# Patient Record
Sex: Female | Born: 1965 | Race: Black or African American | Hispanic: No | Marital: Single | Smoking: Never smoker
Health system: Southern US, Community
[De-identification: ages and names within clinical notes are randomized; demographics above are authoritative.]

## PROBLEM LIST (undated history)

## (undated) DIAGNOSIS — I10 Essential (primary) hypertension: Secondary | ICD-10-CM

## (undated) DIAGNOSIS — J45909 Unspecified asthma, uncomplicated: Secondary | ICD-10-CM

## (undated) DIAGNOSIS — I776 Arteritis, unspecified: Secondary | ICD-10-CM

## (undated) DIAGNOSIS — M069 Rheumatoid arthritis, unspecified: Secondary | ICD-10-CM

## (undated) HISTORY — DX: Unspecified asthma, uncomplicated: J45.909

## (undated) HISTORY — DX: Essential (primary) hypertension: I10

## (undated) HISTORY — DX: Rheumatoid arthritis, unspecified: M06.9

## (undated) HISTORY — DX: Arteritis, unspecified: I77.6

---

## 1997-02-06 HISTORY — PX: CYST REMOVAL TRUNK: SHX6283

## 2004-02-07 HISTORY — PX: ABDOMINAL HYSTERECTOMY: SHX81

## 2009-06-03 ENCOUNTER — Ambulatory Visit: Payer: Self-pay

## 2010-06-07 ENCOUNTER — Ambulatory Visit: Payer: Self-pay | Admitting: Internal Medicine

## 2011-06-07 ENCOUNTER — Ambulatory Visit: Payer: Self-pay

## 2011-06-08 ENCOUNTER — Ambulatory Visit: Payer: Self-pay | Admitting: Internal Medicine

## 2013-11-14 DIAGNOSIS — M5442 Lumbago with sciatica, left side: Secondary | ICD-10-CM | POA: Insufficient documentation

## 2013-11-14 DIAGNOSIS — I10 Essential (primary) hypertension: Secondary | ICD-10-CM | POA: Insufficient documentation

## 2013-11-14 DIAGNOSIS — J3089 Other allergic rhinitis: Secondary | ICD-10-CM | POA: Insufficient documentation

## 2015-01-21 DIAGNOSIS — R002 Palpitations: Secondary | ICD-10-CM | POA: Insufficient documentation

## 2015-01-21 DIAGNOSIS — R9431 Abnormal electrocardiogram [ECG] [EKG]: Secondary | ICD-10-CM | POA: Insufficient documentation

## 2016-12-21 LAB — LIPID PANEL
Cholesterol: 222 — AB (ref 0–200)
Triglycerides: 67 (ref 40–160)

## 2017-05-28 DIAGNOSIS — J984 Other disorders of lung: Secondary | ICD-10-CM | POA: Insufficient documentation

## 2017-06-14 LAB — CBC AND DIFFERENTIAL
HCT: 42 (ref 36–46)
Hemoglobin: 13.2 (ref 12.0–16.0)
Platelets: 242 (ref 150–399)
WBC: 5.8

## 2017-06-14 LAB — HEPATIC FUNCTION PANEL
ALT: 12 (ref 7–35)
AST: 17 (ref 13–35)
Alkaline Phosphatase: 79 (ref 25–125)
Bilirubin, Total: 0.2

## 2017-06-14 LAB — BASIC METABOLIC PANEL
CO2: 28 — AB (ref 13–22)
Chloride: 104 (ref 99–108)
Creatinine: 0.8 (ref 0.5–1.1)
Glucose: 79
Potassium: 4 (ref 3.4–5.3)
Sodium: 145 (ref 137–147)

## 2017-06-14 LAB — COMPREHENSIVE METABOLIC PANEL
Albumin: 4.4 (ref 3.5–5.0)
Calcium: 9.5 (ref 8.7–10.7)
GFR calc Af Amer: 96
GFR calc non Af Amer: 83
Globulin: 3.1

## 2017-06-14 LAB — CBC: RBC: 4.43 (ref 3.87–5.11)

## 2017-06-14 LAB — VITAMIN D 25 HYDROXY (VIT D DEFICIENCY, FRACTURES): Vit D, 25-Hydroxy: 20

## 2017-06-14 LAB — MICROALBUMIN, URINE: Microalb, Ur: 22.8

## 2017-07-20 DIAGNOSIS — G4733 Obstructive sleep apnea (adult) (pediatric): Secondary | ICD-10-CM | POA: Insufficient documentation

## 2017-12-21 LAB — LIPID PANEL
HDL: 67 (ref 35–70)
LDL Cholesterol: 142

## 2018-01-29 LAB — HM MAMMOGRAPHY: HM Mammogram: NORMAL (ref 0–4)

## 2019-02-19 ENCOUNTER — Encounter: Payer: Self-pay | Admitting: Internal Medicine

## 2019-02-19 ENCOUNTER — Ambulatory Visit (INDEPENDENT_AMBULATORY_CARE_PROVIDER_SITE_OTHER): Payer: Medicare Other | Admitting: Internal Medicine

## 2019-02-19 ENCOUNTER — Other Ambulatory Visit: Payer: Self-pay

## 2019-02-19 VITALS — BP 134/78 | HR 93 | Temp 98.1°F | Ht 67.0 in | Wt 241.0 lb

## 2019-02-19 DIAGNOSIS — J984 Other disorders of lung: Secondary | ICD-10-CM | POA: Diagnosis not present

## 2019-02-19 DIAGNOSIS — M62838 Other muscle spasm: Secondary | ICD-10-CM

## 2019-02-19 DIAGNOSIS — I6523 Occlusion and stenosis of bilateral carotid arteries: Secondary | ICD-10-CM | POA: Diagnosis not present

## 2019-02-19 DIAGNOSIS — J0100 Acute maxillary sinusitis, unspecified: Secondary | ICD-10-CM

## 2019-02-19 MED ORDER — DOXYCYCLINE HYCLATE 100 MG PO TABS: 100.0000 mg | ORAL_TABLET | Freq: Two times a day (BID) | ORAL | 0 refills | Status: AC

## 2019-02-19 NOTE — Progress Notes (Signed)
Date:  02/19/2019   Name:  Adrienne Young   DOB:  11/02/1965   MRN:  [id number]   Chief Complaint: Establish Care (Re-establishing care with Dr Army Melia.)  Adrienne Young is a returning previous patient.  She spent the past 7 years working in [place] but is now back in [place].  She is a previous history of hypertension but states that she has not taken medication in 18 months.  While in [place] she apparently had a cardiac evaluation which involved several ECHOs and an apparent carotid ultrasound.  She says that carotid stenosis of about 40% was found but that no surgery or medication was recommended.  Her main complaint today is recurrent muscle spasms or muscle twitching that occurs in various parts of her body at different times.  Most of her symptoms appear to be in her upper extremities and the left side of her face and left upper eyelid.  She reports having had a number of tests with a neurologist in [place].  These included blood work, nerve conduction studies, and CT scans.  She states that no definitive diagnosis was ever made.  She was prescribed Flexeril which she was able to use as needed to control the spasms.  She does report that nerve conduction in her left leg was supposedly abnormal.  Otherwise she has no diagnosis to explain her symptoms.  She also states that during this time she was somehow referred for disability.  She underwent a disability evaluation and was granted Social Security disability.  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  She personally does not feel that she is disabled and that the social system in [place] is poorly managed.  She complains of some chronic shortness of breath that started in approximately 2015.  At that time she was seen for a sinus infection and given a steroid injection intramuscularly.  After that she became much more short of breath despite not having a diagnosis of asthma.  She reports seeing a pulmonologist but not being  placed on any medication or given a definitive diagnosis.  She also feels that the steroid injection incited a lot of her muscular complaints.  Sinusitis -patient states that she has current sinus pressure and congestion across her face and forehead.  She has production of green thick phlegm both from her chest and from her nose.  She is not using any current over-the-counter or prescription medications.  Review of Systems  Constitutional: Negative for chills, fatigue, fever and unexpected weight change.  HENT: Positive for congestion, sinus pressure and sinus pain. Negative for trouble swallowing.   Eyes: Negative for visual disturbance.  Respiratory: Positive for shortness of breath and wheezing. Negative for choking and chest tightness.   Cardiovascular: Negative for chest pain and palpitations.  Musculoskeletal: Positive for myalgias. Negative for arthralgias, gait problem and joint swelling.  Neurological: Positive for weakness. Negative for dizziness, tremors, light-headedness and headaches.  Psychiatric/Behavioral: Negative for dysphoric mood and sleep disturbance. The patient is not nervous/anxious.     Patient Active Problem List   Diagnosis Date Noted  . Obstructive sleep apnea 07/20/2017  . Restrictive lung disease 05/28/2017  . Palpitations 01/21/2015  . Abnormal ambulatory electrocardiogram 01/21/2015  . Other allergic rhinitis 11/14/2013  . Right-sided low back pain without sciatica 11/14/2013  . Essential hypertension 11/14/2013    Allergies  Allergen Reactions  . Dexamethasone Anaphylaxis    Difficulty breathing  . Prednisone Anaphylaxis    Diff breathing  .  Sulfur Hives    Past Surgical History:  Procedure Laterality Date  . ABDOMINAL HYSTERECTOMY  2006   ovaries still present  . CYST REMOVAL TRUNK  1999    Social History   Tobacco Use  . Smoking status: Never Smoker  . Smokeless tobacco: Never Used  Substance Use Topics  . Alcohol use: Never  . Drug  use: Never     Medication list has been reviewed and updated.  Current Meds  Medication Sig  . cyclobenzaprine (FLEXERIL) 10 MG tablet Take 10 mg by mouth 3 (three) times daily as needed for muscle spasms. All over muscle spasms.    PHQ 2/9 Scores 02/19/2019  PHQ - 2 Score 0  PHQ- 9 Score 0    BP Readings from Last 3 Encounters:  02/19/19 134/78    Physical Exam Vitals and nursing note reviewed.  Constitutional:      General: She is not in acute distress.    Appearance: She is well-developed. She is obese.  HENT:     Head: Normocephalic and atraumatic.  Eyes:     General: Lids are normal.     Extraocular Movements: Extraocular movements intact.  Neck:     Vascular: No carotid bruit.  Cardiovascular:     Rate and Rhythm: Normal rate and regular rhythm.     Pulses:          Dorsalis pedis pulses are 1+ on the right side and 1+ on the left side.       Posterior tibial pulses are 1+ on the right side and 1+ on the left side.     Heart sounds: No murmur.  Pulmonary:     Effort: Pulmonary effort is normal. No respiratory distress.     Breath sounds: Normal breath sounds. No wheezing or rhonchi.  Musculoskeletal:        General: Normal range of motion.     Cervical back: Normal range of motion.     Right lower leg: No edema.     Left lower leg: No edema.     Right foot: Deformity present.     Left foot: Deformity (second toe hammertoe deformity) present.  Feet:     Right foot:     Skin integrity: Dry skin present.     Left foot:     Skin integrity: Dry skin present.  Lymphadenopathy:     Cervical: No cervical adenopathy.  Skin:    General: Skin is warm and dry.     Findings: No rash.  Neurological:     Mental Status: She is alert and oriented to person, place, and time.     Cranial Nerves: Cranial nerves are intact.     Sensory: Sensation is intact.     Motor: Motor function is intact. No weakness or tremor.     Coordination: Coordination is intact.     Deep  Tendon Reflexes:     Reflex Scores:      Bicep reflexes are 2+ on the right side and 2+ on the left side.      Patellar reflexes are 2+ on the right side and 2+ on the left side.    Comments: No temporal artery tenderness or cord  Psychiatric:        Behavior: Behavior normal.        Thought Content: Thought content normal.     Wt Readings from Last 3 Encounters:  02/19/19 241 lb (109.3 kg)    BP 134/78   Pulse 93  Temp 98.1 F (36.7 C) (Oral)   Ht 5\' 7"  (1.702 m)   Wt 241 lb (109.3 kg)   SpO2 99%   BMI 37.75 kg/m   Assessment and Plan: 1. Restrictive lung disease She continues to have symptoms of vague shortness of breath, difficulty taking a deep breath and recurrent sinus/chest infections I believe she will need further in-depth testing so will refer to pulmonary for further evaluation - Ambulatory referral to Pulmonology  2. Bilateral carotid artery stenosis No bruits appreciated on exam today Will obtain previous records and studies and refer or treat as appropriate  3. Muscle spasm Her muscle spasms along with weakness and pain that seem to be intermittent and migratory or suggestive of a diffuse CNS process. Again will obtain previous records but proceed with neurology evaluation without delay. She may continue to use Flexeril as needed for spasms. - Ambulatory referral to Neurology  4. Acute non-recurrent maxillary sinusitis Recommend Mucinex D for decongestant activity Avoid steroids including steroid nasal spray due to previous possible adverse reaction. - doxycycline (VIBRA-TABS) 100 MG tablet; Take 1 tablet (100 mg total) by mouth 2 (two) times daily for 10 days.  Dispense: 20 tablet; Refill: 0   Partially dictated using . Any errors are unintentional.  Animal nutritionist, MD [facility]  02/19/2019

## 2019-02-25 ENCOUNTER — Telehealth: Payer: Self-pay

## 2019-02-25 NOTE — Telephone Encounter (Signed)
Pt called to follow up on Neurology referral. Informed her the referral was faxed 02/19/19 and to give [facility] a little more time to contact her. Told her if they don't contact her by next week to call me back and let me know.   Lovett Calender, CMA

## 2019-03-04 DIAGNOSIS — E559 Vitamin D deficiency, unspecified: Secondary | ICD-10-CM | POA: Insufficient documentation

## 2019-03-07 ENCOUNTER — Telehealth: Payer: Self-pay

## 2019-03-07 NOTE — Telephone Encounter (Signed)
Patient called just to let our office know she was scheduled with Dr Malvin Johns for Neurology appt 03/12/2019.  FYI.

## 2019-03-12 LAB — TSH: TSH: 3.04 (ref 0.41–5.90)

## 2019-03-12 LAB — CBC AND DIFFERENTIAL
Hemoglobin: 13.2 (ref 12.0–16.0)
Platelets: 317 (ref 150–399)
WBC: 7

## 2019-03-12 LAB — HEPATIC FUNCTION PANEL
ALT: 12 (ref 7–35)
AST: 15 (ref 13–35)
Alkaline Phosphatase: 86 (ref 25–125)

## 2019-03-12 LAB — BASIC METABOLIC PANEL
BUN: 14 (ref 4–21)
Creatinine: 0.9 (ref 0.5–1.1)
Glucose: 103
Potassium: 4.5 (ref 3.4–5.3)
Sodium: 140 (ref 137–147)

## 2019-03-12 LAB — VITAMIN B12: Vitamin B-12: 265

## 2019-03-12 LAB — HEMOGLOBIN A1C: Hemoglobin A1C: 6.1

## 2019-03-14 DIAGNOSIS — R52 Pain, unspecified: Secondary | ICD-10-CM | POA: Insufficient documentation

## 2019-03-14 DIAGNOSIS — M62838 Other muscle spasm: Secondary | ICD-10-CM | POA: Insufficient documentation

## 2019-03-14 DIAGNOSIS — R200 Anesthesia of skin: Secondary | ICD-10-CM | POA: Insufficient documentation

## 2019-03-14 DIAGNOSIS — E538 Deficiency of other specified B group vitamins: Secondary | ICD-10-CM | POA: Insufficient documentation

## 2019-03-14 DIAGNOSIS — H02402 Unspecified ptosis of left eyelid: Secondary | ICD-10-CM | POA: Insufficient documentation

## 2019-03-19 ENCOUNTER — Ambulatory Visit: Payer: Medicare Other | Admitting: Pulmonary Disease

## 2019-03-19 ENCOUNTER — Encounter: Payer: Self-pay | Admitting: Pulmonary Disease

## 2019-03-19 ENCOUNTER — Other Ambulatory Visit: Payer: Self-pay

## 2019-03-19 VITALS — BP 152/94 | HR 98 | Temp 97.5°F | Ht 67.0 in | Wt 250.2 lb

## 2019-03-19 DIAGNOSIS — I5189 Other ill-defined heart diseases: Secondary | ICD-10-CM

## 2019-03-19 DIAGNOSIS — E669 Obesity, unspecified: Secondary | ICD-10-CM

## 2019-03-19 DIAGNOSIS — R0602 Shortness of breath: Secondary | ICD-10-CM

## 2019-03-19 DIAGNOSIS — G4733 Obstructive sleep apnea (adult) (pediatric): Secondary | ICD-10-CM

## 2019-03-19 DIAGNOSIS — K219 Gastro-esophageal reflux disease without esophagitis: Secondary | ICD-10-CM

## 2019-03-19 MED ORDER — PANTOPRAZOLE SODIUM 40 MG PO TBEC: 40.0000 mg | DELAYED_RELEASE_TABLET | Freq: Every day | ORAL | 1 refills | Status: DC

## 2019-03-19 NOTE — Progress Notes (Signed)
    Assessment & Plan:  1. Shortness of breath (Primary) - ECHOCARDIOGRAM COMPLETE; Future  2. Diastolic dysfunction  3. Obstructive sleep apnea  4. Gastroesophageal reflux disease, unspecified whether esophagitis present  5. Obesity (BMI 35.0-39.9 without comorbidity)   Patient Instructions  We will schedule you breathing tests when these become available, there are restrictions currently due to COVID-19  You have signed a release of information so we can get the results of your sleep study  I believe your shortness of breath issues have to do with deconditioning, poor compliance of your heart and unresolved issues with sleep apnea  We will get a heart test to evaluate your heart chambers  Your blood pressure was high today recommend that you reconvene with Dr. Justus to restart your blood pressure medication  We will start medication for your acid reflux  Please note: late entry documentation due to logistical difficulties during COVID-19 pandemic. This note is filed for information purposes only, and is not intended to be used for billing, nor does it represent the full scope/nature of the visit in question. Please see any associated scanned media linked to date of encounter for additional pertinent information.  Subjective:    HPI: Adrienne Young is a 54 y.o. female presenting to the pulmonology clinic on 03/19/2019 with report of: Pulmonary Consult (Referred by PCP for SOB-consistent. Very fatigued. Occ wheezing. Pt denies any cough, fever, or chills)     Outpatient Encounter Medications as of 03/19/2019  Medication Sig   [DISCONTINUED] cyclobenzaprine (FLEXERIL) 10 MG tablet Take 10 mg by mouth 3 (three) times daily as needed for muscle spasms. All over muscle spasms. (Patient not taking: Reported on 04/26/2020)   [DISCONTINUED] pantoprazole  (PROTONIX ) 40 MG tablet Take 1 tablet (40 mg total) by mouth daily. (Patient taking differently: Take 40 mg by mouth daily. Dr  Tamea)   No facility-administered encounter medications on file as of 03/19/2019.      Objective:   Vitals:   03/19/19 1155  BP: (!) 152/94  Pulse: 98  Temp: (!) 97.5 F (36.4 C)  Height: 5' 7 (1.702 m)  Weight: 250 lb 3.2 oz (113.5 kg)  SpO2: 99% Comment: on ra  TempSrc: Temporal  BMI (Calculated): 39.18     Physical exam documentation is limited by delayed entry of information.

## 2019-03-19 NOTE — Patient Instructions (Signed)
We will schedule you breathing tests when these become available, there are restrictions currently due to COVID-19  You have signed a release of information so we can get the results of your sleep study  I believe your shortness of breath issues have to do with deconditioning, poor compliance of your heart and unresolved issues with sleep apnea  We will get a heart test to evaluate your heart chambers  Your blood pressure was high today recommend that you reconvene with Dr. Judithann Graves to restart your blood pressure medication  We will start medication for your acid reflux

## 2019-04-03 DIAGNOSIS — R200 Anesthesia of skin: Secondary | ICD-10-CM | POA: Insufficient documentation

## 2019-04-04 ENCOUNTER — Other Ambulatory Visit: Payer: Self-pay

## 2019-04-04 ENCOUNTER — Ambulatory Visit
Admission: RE | Admit: 2019-04-04 | Discharge: 2019-04-04 | Disposition: A | Discharge status: Home / Self Care | Payer: Medicare Other | Source: Ambulatory Visit | Attending: Pulmonary Disease | Admitting: Pulmonary Disease

## 2019-04-04 DIAGNOSIS — I10 Essential (primary) hypertension: Secondary | ICD-10-CM | POA: Insufficient documentation

## 2019-04-04 DIAGNOSIS — G4730 Sleep apnea, unspecified: Secondary | ICD-10-CM | POA: Diagnosis not present

## 2019-04-04 DIAGNOSIS — R0602 Shortness of breath: Secondary | ICD-10-CM | POA: Insufficient documentation

## 2019-04-04 NOTE — Progress Notes (Signed)
*  PRELIMINARY RESULTS* Echocardiogram 2D Echocardiogram has been performed.  Adrienne Young 04/04/2019, 12:22 PM

## 2019-04-11 ENCOUNTER — Telehealth: Payer: Self-pay | Admitting: Pulmonary Disease

## 2019-04-11 NOTE — Telephone Encounter (Signed)
Salena Saner, MD  Rosaland Lao, CMA   Cc: Purcell Mouton, CMA  Echocardiogram looks relatively good.She does have a stiff main chamber which is seen with high blood pressure and can cause shortness of breath. Recommend good BP control,her primary MD can advise her on this.    Pt is aware of results and her understanding.  Results have been faxed to PCP via epic.  Nothing further is needed.

## 2019-04-16 ENCOUNTER — Telehealth: Payer: Self-pay

## 2019-04-16 ENCOUNTER — Telehealth: Payer: Self-pay | Admitting: Pulmonary Disease

## 2019-04-16 ENCOUNTER — Other Ambulatory Visit: Payer: Self-pay | Admitting: Internal Medicine

## 2019-04-16 NOTE — Telephone Encounter (Signed)
Incoming call from patient, she states that the pulmonary doctor said that her blood pressure needs to be under better controlled, which may help her breathing issue, the problem seen on the Echo is usually associated with uncontrolled BP.   She was started on an acid reflux medication, which she stopped because it was causing stomach pain.  Dr.Potter started her on a muscle relaxer that is causing dry skin, fatigue and her body to be stiff.  Was also started on a medication that is BID, she is unsure of what that medication is for, or what it is.   She states that she is waiting on a breathing test through pulmonary, but they are not currently doing them due to covid. (Non urgent pulmonary function test) Patient states that the wheezing is worse, she states that it sounds like someone singing and whistling when she breathes.   She is due for a follow up with Pulmonary in April, they will get that appt scheduled and notify her with the time and date.  If they have started the test again she will be scheduled at that time.

## 2019-04-16 NOTE — Telephone Encounter (Signed)
Appt scheduled

## 2019-04-16 NOTE — Telephone Encounter (Signed)
Spoke with Tiffany Reel, CMA, who had questions regarding pt's PFT.  I have made Tiffany aware that [facility] is not scheduling non urgent PFT's at this time due to covid, however pt is due for an appt in April and PFT will be scheduled at that time if PFT's have resumed.  Spoke to pt and scheduled her for an office visit with Dr. Jayme Cloud for 06/03/2019. Also made pt aware that PFT's are not being preformed at this time.  Nothing further is needed.

## 2019-04-16 NOTE — Telephone Encounter (Signed)
Please schedule a BP OV

## 2019-04-22 ENCOUNTER — Encounter: Payer: Self-pay | Admitting: Internal Medicine

## 2019-04-22 ENCOUNTER — Other Ambulatory Visit: Payer: Self-pay

## 2019-04-22 ENCOUNTER — Ambulatory Visit (INDEPENDENT_AMBULATORY_CARE_PROVIDER_SITE_OTHER): Payer: Medicare Other | Admitting: Internal Medicine

## 2019-04-22 VITALS — BP 142/98 | HR 92 | Temp 97.1°F | Ht 67.0 in | Wt 255.0 lb

## 2019-04-22 DIAGNOSIS — R7303 Prediabetes: Secondary | ICD-10-CM

## 2019-04-22 DIAGNOSIS — I5189 Other ill-defined heart diseases: Secondary | ICD-10-CM | POA: Diagnosis not present

## 2019-04-22 DIAGNOSIS — E538 Deficiency of other specified B group vitamins: Secondary | ICD-10-CM

## 2019-04-22 DIAGNOSIS — I10 Essential (primary) hypertension: Secondary | ICD-10-CM | POA: Diagnosis not present

## 2019-04-22 DIAGNOSIS — I6523 Occlusion and stenosis of bilateral carotid arteries: Secondary | ICD-10-CM | POA: Diagnosis not present

## 2019-04-22 MED ORDER — METOPROLOL SUCCINATE ER 25 MG PO TB24: 25.0000 mg | ORAL_TABLET | Freq: Every day | ORAL | 0 refills | Status: DC

## 2019-04-22 NOTE — Patient Instructions (Signed)
Get a B12 supplement and take daily  Cut back on sweet drinks to avoid progression to diabetes

## 2019-04-22 NOTE — Progress Notes (Signed)
Date:  04/22/2019   Name:  Adrienne Young   DOB:  16-Feb-1965   MRN:  [id number]   Chief Complaint: Hypertension (Follow up. )  Hypertension This is a recurrent problem. The problem is unchanged. The problem is uncontrolled. Associated symptoms include shortness of breath. Pertinent negatives include no chest pain, headaches, palpitations, peripheral edema or PND. Past treatments include ACE inhibitors. The current treatment provides no improvement. There are no compliance problems.   B12 deficiency - has not started a supplement. Muscle spasms - labs normal except for B12 and prediabetes.  Started on clonazepam which helped some. Then added gabapentin which made her too dried out and unable to swallow.  May have also made spasms worse.  She sees Dr. Malvin Johns tomorrow for follow up. Pre-diabetes - noted on recent labs.  Pt consumes juice and regular sodas.  Lab Results  Component Value Date   CREATININE 0.9 03/12/2019   BUN 14 03/12/2019   NA 140 03/12/2019   K 4.5 03/12/2019   CL 104 06/14/2017   CO2 28 (A) 06/14/2017   Lab Results  Component Value Date   CHOL 222 (A) 12/21/2016   HDL 67 12/21/2017   LDLCALC 142 12/21/2017   TRIG 67 12/21/2016   Lab Results  Component Value Date   TSH 3.04 03/12/2019   Lab Results  Component Value Date   HGBA1C 6.1 03/12/2019   Lab Results  Component Value Date   WBC 7.0 03/12/2019   HGB 13.2 03/12/2019   HCT 42 06/14/2017   PLT 317 03/12/2019   Lab Results  Component Value Date   ALT 12 03/12/2019   AST 15 03/12/2019   ALKPHOS 86 03/12/2019     Review of Systems  Constitutional: Negative for chills, fatigue and fever.  Respiratory: Positive for shortness of breath and wheezing. Negative for cough and chest tightness.   Cardiovascular: Negative for chest pain, palpitations and PND.  Musculoskeletal: Positive for myalgias.  Neurological: Negative for tremors, weakness, numbness and headaches.  Psychiatric/Behavioral: Negative  for dysphoric mood. The patient is not nervous/anxious.     Patient Active Problem List   Diagnosis Date Noted  . Numbness of left hand 04/03/2019  . Shortness of breath 03/19/2019  . Diastolic dysfunction 03/19/2019  . Gastroesophageal reflux disease 03/19/2019  . Obesity (BMI 35.0-39.9 without comorbidity) 03/19/2019  . Vitamin B12 deficiency 03/14/2019  . Pain of left side of body 03/14/2019  . Muscle spasm 03/14/2019  . Drooping eyelid, left 03/14/2019  . Anesthesia of skin 03/14/2019  . Vitamin D deficiency 03/04/2019  . Bilateral carotid artery stenosis 02/19/2019  . Obstructive sleep apnea 07/20/2017  . Restrictive lung disease 05/28/2017  . Palpitations 01/21/2015  . Abnormal ambulatory electrocardiogram 01/21/2015  . Other allergic rhinitis 11/14/2013  . Left-sided low back pain with left-sided sciatica 11/14/2013  . Essential hypertension 11/14/2013    Allergies  Allergen Reactions  . Dexamethasone Anaphylaxis    Difficulty breathing  . Prednisone Anaphylaxis    Diff breathing  . Sulfa Antibiotics Other (See Comments)    Past Surgical History:  Procedure Laterality Date  . ABDOMINAL HYSTERECTOMY  2006   ovaries still present  . CYST REMOVAL TRUNK  1999    Social History   Tobacco Use  . Smoking status: Never Smoker  . Smokeless tobacco: Never Used  Substance Use Topics  . Alcohol use: Never  . Drug use: Never     Medication list has been reviewed and updated.  Current Meds  Medication Sig  . clonazePAM (KLONOPIN) 0.25 MG disintegrating tablet Take 1 tablet by mouth 2 (two) times daily as needed. Dr. Melrose Nakayama  . cyclobenzaprine (FLEXERIL) 10 MG tablet Take 10 mg by mouth 3 (three) times daily as needed for muscle spasms. All over muscle spasms.  Marland Kitchen gabapentin (NEURONTIN) 100 MG capsule Take 200 mg by mouth 2 (two) times daily. Dr Melrose Nakayama  . pantoprazole (PROTONIX) 40 MG tablet Take 1 tablet (40 mg total) by mouth daily. (Patient taking differently:  Take 40 mg by mouth daily. Dr Patsey Berthold)    [facility] 2/9 Scores 04/22/2019 02/19/2019  PHQ - 2 Score 0 0  PHQ- 9 Score - 0    BP Readings from Last 3 Encounters:  04/22/19 (!) 142/98  03/19/19 (!) 152/94  02/19/19 134/78    Physical Exam Vitals and nursing note reviewed.  Constitutional:      General: She is not in acute distress.    Appearance: Normal appearance. She is well-developed.  HENT:     Head: Normocephalic and atraumatic.  Cardiovascular:     Rate and Rhythm: Normal rate and regular rhythm.     Pulses: Normal pulses.     Heart sounds: No murmur.  Pulmonary:     Effort: Pulmonary effort is normal. No respiratory distress.     Breath sounds: No wheezing or rhonchi.  Musculoskeletal:        General: Normal range of motion.     Cervical back: Normal range of motion.     Right lower leg: No edema.     Left lower leg: No edema.  Lymphadenopathy:     Cervical: No cervical adenopathy.  Skin:    General: Skin is warm and dry.     Capillary Refill: Capillary refill takes less than 2 seconds.     Findings: No rash.  Neurological:     General: No focal deficit present.     Mental Status: She is alert and oriented to person, place, and time.  Psychiatric:        Behavior: Behavior normal.        Thought Content: Thought content normal.     Wt Readings from Last 3 Encounters:  04/22/19 255 lb (115.7 kg)  03/19/19 250 lb 3.2 oz (113.5 kg)  02/19/19 241 lb (109.3 kg)    BP (!) 142/98   Pulse 92   Temp (!) 97.1 F (36.2 C) (Temporal)   Ht 5\' 7"  (1.702 m)   Wt 255 lb (115.7 kg)   SpO2 99%   BMI 39.94 kg/m   Assessment and Plan: 1. Essential hypertension Start low dose beta blocker since intolerant of ACEI in the past Recheck in 3 months - metoprolol succinate (TOPROL-XL) 25 MG 24 hr tablet; Take 1 tablet (25 mg total) by mouth daily.  Dispense: 90 tablet; Refill: 0  2. Diastolic dysfunction - metoprolol succinate (TOPROL-XL) 25 MG 24 hr tablet; Take 1 tablet  (25 mg total) by mouth daily.  Dispense: 90 tablet; Refill: 0  3. Vitamin B12 deficiency Begin oral supplement  4. Prediabetes Patient advised to avoid sweets and reduce intake of sugary beverages   Partially dictated using Editor, commissioning. Any errors are unintentional.  Halina Maidens, MD [facility]  04/22/2019

## 2019-04-24 ENCOUNTER — Other Ambulatory Visit: Payer: Self-pay | Admitting: Neurology

## 2019-04-24 DIAGNOSIS — R2990 Unspecified symptoms and signs involving the nervous system: Secondary | ICD-10-CM

## 2019-05-07 ENCOUNTER — Ambulatory Visit: Payer: Medicare Other

## 2019-05-20 ENCOUNTER — Encounter: Payer: Self-pay | Admitting: Internal Medicine

## 2019-05-20 ENCOUNTER — Other Ambulatory Visit: Payer: Self-pay

## 2019-05-20 ENCOUNTER — Ambulatory Visit (INDEPENDENT_AMBULATORY_CARE_PROVIDER_SITE_OTHER): Payer: Medicare Other | Admitting: Internal Medicine

## 2019-05-20 VITALS — BP 118/70 | HR 78 | Temp 97.5°F | Ht 67.0 in | Wt 263.0 lb

## 2019-05-20 DIAGNOSIS — I5189 Other ill-defined heart diseases: Secondary | ICD-10-CM

## 2019-05-20 DIAGNOSIS — Z1231 Encounter for screening mammogram for malignant neoplasm of breast: Secondary | ICD-10-CM

## 2019-05-20 DIAGNOSIS — K219 Gastro-esophageal reflux disease without esophagitis: Secondary | ICD-10-CM

## 2019-05-20 DIAGNOSIS — M2550 Pain in unspecified joint: Secondary | ICD-10-CM

## 2019-05-20 DIAGNOSIS — I6523 Occlusion and stenosis of bilateral carotid arteries: Secondary | ICD-10-CM

## 2019-05-20 DIAGNOSIS — I10 Essential (primary) hypertension: Secondary | ICD-10-CM | POA: Diagnosis not present

## 2019-05-20 DIAGNOSIS — Z1211 Encounter for screening for malignant neoplasm of colon: Secondary | ICD-10-CM

## 2019-05-20 DIAGNOSIS — J984 Other disorders of lung: Secondary | ICD-10-CM

## 2019-05-20 MED ORDER — ALBUTEROL SULFATE HFA 108 (90 BASE) MCG/ACT IN AERS: 2.0000 | INHALATION_SPRAY | Freq: Four times a day (QID) | RESPIRATORY_TRACT | 3 refills | Status: DC | PRN

## 2019-05-20 NOTE — Progress Notes (Signed)
Date:  05/20/2019   Name:  Adrienne Young   DOB:  1965/10/14   MRN:  [id number]   Chief Complaint: Hypertension, Joint Pain (Ankles, knees, and wrist joint pains. 2 weeks- getting worse. Feels like joints are weakening. ), and throat issue (Left side of throat feels like its closing up and then fluid comes up. Last night drunk 6 oz of water and after laying down the water came back up her throat. Mucous coming up dry and taupe colored. )  Hypertension This is a chronic problem. The problem is controlled. Pertinent negatives include no chest pain, palpitations or shortness of breath. Past treatments include beta blockers. The current treatment provides significant improvement. mild diastolic dysfunction.  Gastroesophageal Reflux She complains of coughing, globus sensation, heartburn, water brash and wheezing. She reports no abdominal pain or no chest pain. This is a recurrent problem. The problem occurs occasionally. Associated symptoms include fatigue. She has tried a PPI (she thinks pantoprazole caused her stomach to be tight so she stopped it.) for the symptoms.  Arthritis Presents for follow-up visit. She complains of pain, stiffness and joint swelling. The symptoms have been stable. Affected locations include the left wrist, right wrist, right knee and left knee. Her pain is at a severity of 4/10. Associated symptoms include fatigue. Pertinent negatives include no diarrhea or fever. (Was told previously that she had RA but she does not know what she was treated with)    Lab Results  Component Value Date   CREATININE 0.9 03/12/2019   BUN 14 03/12/2019   NA 140 03/12/2019   K 4.5 03/12/2019   CL 104 06/14/2017   CO2 28 (A) 06/14/2017   Lab Results  Component Value Date   CHOL 222 (A) 12/21/2016   HDL 67 12/21/2017   LDLCALC 142 12/21/2017   TRIG 67 12/21/2016   Lab Results  Component Value Date   TSH 3.04 03/12/2019   Lab Results  Component Value Date   HGBA1C 6.1 03/12/2019    Lab Results  Component Value Date   WBC 7.0 03/12/2019   HGB 13.2 03/12/2019   HCT 42 06/14/2017   PLT 317 03/12/2019   Lab Results  Component Value Date   ALT 12 03/12/2019   AST 15 03/12/2019   ALKPHOS 86 03/12/2019     Review of Systems  Constitutional: Positive for fatigue. Negative for chills and fever.  Respiratory: Positive for cough, chest tightness and wheezing. Negative for shortness of breath and stridor.   Cardiovascular: Negative for chest pain, palpitations and leg swelling.  Gastrointestinal: Positive for heartburn. Negative for abdominal pain, blood in stool, constipation and diarrhea.  Musculoskeletal: Positive for arthralgias, arthritis, joint swelling, myalgias and stiffness.  Neurological: Negative for dizziness.    Patient Active Problem List   Diagnosis Date Noted  . Prediabetes 04/22/2019  . Numbness of left hand 04/03/2019  . Shortness of breath 03/19/2019  . Diastolic dysfunction 23/55/7322  . Gastroesophageal reflux disease 03/19/2019  . Obesity (BMI 35.0-39.9 without comorbidity) 03/19/2019  . Vitamin B12 deficiency 03/14/2019  . Pain of left side of body 03/14/2019  . Muscle spasm 03/14/2019  . Drooping eyelid, left 03/14/2019  . Anesthesia of skin 03/14/2019  . Vitamin D deficiency 03/04/2019  . Bilateral carotid artery stenosis 02/19/2019  . Obstructive sleep apnea 07/20/2017  . Restrictive lung disease 05/28/2017  . Palpitations 01/21/2015  . Abnormal ambulatory electrocardiogram 01/21/2015  . Other allergic rhinitis 11/14/2013  . Left-sided low back pain with left-sided sciatica  11/14/2013  . Essential hypertension 11/14/2013    Allergies  Allergen Reactions  . Dexamethasone Anaphylaxis    Difficulty breathing  . Prednisone Anaphylaxis    Diff breathing  . Sulfa Antibiotics Other (See Comments)    Past Surgical History:  Procedure Laterality Date  . ABDOMINAL HYSTERECTOMY  2006   ovaries still present  . CYST REMOVAL  TRUNK  1999    Social History   Tobacco Use  . Smoking status: Never Smoker  . Smokeless tobacco: Never Used  Substance Use Topics  . Alcohol use: Never  . Drug use: Never     Medication list has been reviewed and updated.  Current Meds  Medication Sig  . clonazePAM (KLONOPIN) 0.25 MG disintegrating tablet Take 1 tablet by mouth 2 (two) times daily as needed. Dr. Malvin Johns  . cyclobenzaprine (FLEXERIL) 10 MG tablet Take 10 mg by mouth 3 (three) times daily as needed for muscle spasms. All over muscle spasms.  . divalproex (DEPAKOTE) 125 MG DR tablet Take 125 mg by mouth in the morning and at bedtime.  . metoprolol succinate (TOPROL-XL) 25 MG 24 hr tablet Take 1 tablet (25 mg total) by mouth daily.  . pantoprazole (PROTONIX) 40 MG tablet Take 1 tablet (40 mg total) by mouth daily. (Patient taking differently: Take 40 mg by mouth daily. Dr Jayme Cloud)    [facility] 2/9 Scores 05/20/2019 04/22/2019 02/19/2019  PHQ - 2 Score 1 0 0  PHQ- 9 Score 9 - 0    BP Readings from Last 3 Encounters:  05/20/19 118/70  04/22/19 (!) 142/98  03/19/19 (!) 152/94    Physical Exam Vitals and nursing note reviewed.  Constitutional:      General: She is not in acute distress.    Appearance: Normal appearance. She is well-developed.  HENT:     Head: Normocephalic and atraumatic.  Cardiovascular:     Rate and Rhythm: Normal rate and regular rhythm.     Pulses: Normal pulses.  Pulmonary:     Effort: Pulmonary effort is normal. No respiratory distress.     Breath sounds: Wheezing present. No rhonchi.  Musculoskeletal:     Right wrist: Swelling and tenderness present.     Left wrist: Swelling and tenderness present.     Cervical back: Normal range of motion.     Right knee: Swelling present. Tenderness present.     Left knee: Swelling present. Tenderness present.     Right lower leg: No edema.     Left lower leg: No edema.  Lymphadenopathy:     Cervical: No cervical adenopathy.  Skin:    General:  Skin is warm and dry.     Findings: No rash.  Neurological:     Mental Status: She is alert and oriented to person, place, and time.  Psychiatric:        Attention and Perception: Attention normal.        Mood and Affect: Mood is anxious.        Speech: Speech normal.        Behavior: Behavior normal.     Wt Readings from Last 3 Encounters:  05/20/19 263 lb (119.3 kg)  04/22/19 255 lb (115.7 kg)  03/19/19 250 lb 3.2 oz (113.5 kg)    BP 118/70   Pulse 78   Temp (!) 97.5 F (36.4 C) (Temporal)   Ht 5\' 7"  (1.702 m)   Wt 263 lb (119.3 kg)   SpO2 97%   BMI 41.19 kg/m   Assessment  and Plan: 1. Essential hypertension Clinically stable exam with well controlled BP. Tolerating medications without side effects at this time. Pt to continue current regimen and low sodium diet; benefits of regular exercise as able discussed.  2. Gastroesophageal reflux disease, unspecified whether esophagitis present Try pantoprazole again and if abdominal pain recurs, will try a different PPI  3. Diastolic dysfunction On metoprolol with good control of HTN  4. Encounter for screening mammogram for breast cancer Schedule at [facility] - MM 3D SCREEN BREAST BILATERAL; Future  5. Colon cancer screening - Fecal occult blood, imunochemical  6. Arthralgia, unspecified joint Pt reports hx of RA but currently not being treated Will obtain screening labs - Rheumatoid factor - Sedimentation rate - ANA w/Reflex if Positive  7. Restrictive lung disease Waiting for PFTs which are on hold due to Covid Begin albuterol PRN rather than daily maintenance like Advair in case she gets the call for PFTs  - albuterol (VENTOLIN HFA) 108 (90 Base) MCG/ACT inhaler; Inhale 2 puffs into the lungs every 6 (six) hours as needed for wheezing or shortness of breath.  Dispense: 18 g; Refill: 3   Partially dictated using Animal nutritionist. Any errors are unintentional.  Bari Edward, MD [facility]  05/20/2019

## 2019-05-21 ENCOUNTER — Ambulatory Visit
Admission: RE | Admit: 2019-05-21 | Discharge: 2019-05-21 | Disposition: A | Discharge status: Home / Self Care | Payer: Medicare Other | Source: Ambulatory Visit | Attending: Neurology | Admitting: Neurology

## 2019-05-21 DIAGNOSIS — R2990 Unspecified symptoms and signs involving the nervous system: Secondary | ICD-10-CM | POA: Diagnosis present

## 2019-05-21 LAB — ANA W/REFLEX IF POSITIVE: Anti Nuclear Antibody (ANA): NEGATIVE

## 2019-05-21 LAB — RHEUMATOID FACTOR: Rheumatoid fact SerPl-aCnc: <10 IU/mL (ref 0.0–13.9)

## 2019-05-21 LAB — SEDIMENTATION RATE: Sed Rate: 95 mm/hr — ABNORMAL HIGH (ref 0–40)

## 2019-05-27 ENCOUNTER — Other Ambulatory Visit: Payer: Self-pay

## 2019-05-27 DIAGNOSIS — R700 Elevated erythrocyte sedimentation rate: Secondary | ICD-10-CM

## 2019-05-27 NOTE — Progress Notes (Signed)
Tried calling pt back. Left VM informing her we placed referral and someone from rheumatology should be calling her to schedule soon.

## 2019-05-30 ENCOUNTER — Other Ambulatory Visit: Payer: Self-pay | Admitting: Internal Medicine

## 2019-05-30 DIAGNOSIS — Z1231 Encounter for screening mammogram for malignant neoplasm of breast: Secondary | ICD-10-CM

## 2019-06-03 ENCOUNTER — Other Ambulatory Visit: Payer: Self-pay

## 2019-06-03 ENCOUNTER — Ambulatory Visit (INDEPENDENT_AMBULATORY_CARE_PROVIDER_SITE_OTHER): Payer: Medicare Other | Admitting: Pulmonary Disease

## 2019-06-03 ENCOUNTER — Ambulatory Visit
Admission: RE | Admit: 2019-06-03 | Discharge: 2019-06-03 | Disposition: A | Discharge status: Home / Self Care | Payer: Medicare Other | Source: Ambulatory Visit

## 2019-06-03 ENCOUNTER — Encounter: Payer: Self-pay | Admitting: Pulmonary Disease

## 2019-06-03 VITALS — BP 128/80 | HR 90 | Ht 67.0 in | Wt 266.8 lb

## 2019-06-03 DIAGNOSIS — J984 Other disorders of lung: Secondary | ICD-10-CM | POA: Diagnosis not present

## 2019-06-03 DIAGNOSIS — Z1231 Encounter for screening mammogram for malignant neoplasm of breast: Secondary | ICD-10-CM

## 2019-06-03 DIAGNOSIS — R0602 Shortness of breath: Secondary | ICD-10-CM | POA: Diagnosis not present

## 2019-06-03 DIAGNOSIS — G4733 Obstructive sleep apnea (adult) (pediatric): Secondary | ICD-10-CM | POA: Diagnosis not present

## 2019-06-03 DIAGNOSIS — R700 Elevated erythrocyte sedimentation rate: Secondary | ICD-10-CM | POA: Diagnosis not present

## 2019-06-03 DIAGNOSIS — J328 Other chronic sinusitis: Secondary | ICD-10-CM

## 2019-06-03 NOTE — Progress Notes (Signed)
Subjective:    Patient ID: Adrienne Young, female    DOB: September 27, 1965, 54 y.o.   MRN: [id number]  HPI Lilya is a 54 year old never smoker who follows from her initial visit here on 19 March 2019.  At that time she was being evaluated for shortness of breath wheezing and "muscle spasms".  She has noted the symptoms since 2015.  She notes that rest and doing nothing helped her breathing.  She doe note dyspnea on walking and talking and sometimes even while sitting if she is talking.  She has had a sleep study that she states was not accurate and was told she needed CPAP.  This was done in [place].  She has recently relocated to this area and never got CPAP.  We did manage  to get copies of her sleep test performed in [place] AHI of 17 and oxygen saturations decreased to as low as 82% auto CPAP was recommended however she never got this.  It was performed in 2019.  Because her echocardiogram obtained on 26 February this shows that she has diastolic dysfunction and EF is 50 to 55% dictating that the left ventricle has low normal function.  There is mild right atrial pressure elevation at 8 mm calculated.  We discussed the associations with diastolic dysfunction and right-sided dysfunction in the setting of sleep apnea.  I suspect her sleep apnea is worse as she has gained significant amount of weight from when her study was obtained in 2019.  She is being evaluated for "muscle spasms" by neurology.  I cannot ascribe a pulmonary etiology to these findings unless these were nocturnal PLMS.  Still complain of arthralgias and myalgias.  It to have restrictive physiology on prior PFTs as per the pulmonary notes from [place].  Had some epistaxis.  She has chronic sinus issues of longstanding.  Inflammatory markers have been elevated.  Review of Systems A 10 point review of systems was performed and it is as noted above otherwise negative.  Patient Active Problem List   Diagnosis Date Noted  . Prediabetes  04/22/2019  . Numbness of left hand 04/03/2019  . Shortness of breath 03/19/2019  . Diastolic dysfunction 03/19/2019  . Gastroesophageal reflux disease 03/19/2019  . Obesity (BMI 35.0-39.9 without comorbidity) 03/19/2019  . Vitamin B12 deficiency 03/14/2019  . Pain of left side of body 03/14/2019  . Muscle spasm 03/14/2019  . Drooping eyelid, left 03/14/2019  . Anesthesia of skin 03/14/2019  . Vitamin D deficiency 03/04/2019  . Bilateral carotid artery stenosis 02/19/2019  . Obstructive sleep apnea 07/20/2017  . Restrictive lung disease 05/28/2017  . Palpitations 01/21/2015  . Abnormal ambulatory electrocardiogram 01/21/2015  . Other allergic rhinitis 11/14/2013  . Left-sided low back pain with left-sided sciatica 11/14/2013  . Essential hypertension 11/14/2013   Allergies  Allergen Reactions  . Dexamethasone Anaphylaxis    Difficulty breathing  . Prednisone Anaphylaxis    Diff breathing  . Sulfa Antibiotics Other (See Comments)   Medications have been reviewed and are as noted on the flowsheet.  Immunization History  Administered Date(s) Administered  . Influenza,inj,Quad PF,6+ Mos 11/14/2013   We discussed Covid-19 precautions.  I reviewed the vaccine effectiveness and potential side effects in detail to include differences between mRNA vaccines and traditional vaccines (attenuated virus).  Discussion also offered of long-term effectiveness and safety profile which are unclear at this time.  Discussed current CDC guidance that all patients are recommended COVID-19 vaccinations -as long as they do not have allergy to components  of the vaccine.     Objective:   Physical Exam BP 128/80 (BP Location: Left Arm, Cuff Size: Large)   Pulse 90   Ht 5\' 7"  (1.702 m)   Wt 266 lb 12.8 oz (121 kg)   SpO2 97%   BMI 41.79 kg/m  GENERAL: Obese woman, no acute distress, somewhat anxious.  Fully ambulatory. HEAD: Normocephalic, atraumatic.  EYES: Pupils equal, round, reactive to  light.  No scleral icterus.  MOUTH: Nose/mouth/throat not examined due to masking requirements for COVID 19. NECK: Supple. No thyromegaly. Trachea midline. No JVD.  No adenopathy. PULMONARY: Good air entry bilaterally.  No adventitious sounds. CARDIOVASCULAR: S1 and S2. Regular rate and rhythm.  No rubs, murmurs or gallops heard. ABDOMEN: Obese otherwise benign. MUSCULOSKELETAL: No joint deformity, no clubbing, no edema.  No active synovitis noted. NEUROLOGIC: No focal deficit, no gait disturbance, speech is fluent. SKIN: Intact,warm,dry. PSYCH: Anxious mood, normal behavior.       Assessment & Plan:     ICD-10-CM   1. Shortness of breath  R06.02 Pulmonary Function Test [facility] Only    DG Chest 2 View   We will obtain PFTs 2D echo shows diastolic dysfunction Obesity may be playing a part  2. Obstructive sleep apnea  G47.33 AMB REFERRAL FOR DME   Auto CPAP has been requested 5-20 cm H2O Consider evaluation by [place] sleep [facility]  3. Restrictive lung disease  J98.4 Pulmonary Function Test [facility] Only    DG Chest 2 View   Repeat PFTs Increase inflammatory markers Arthralgias epistaxis Check ANCA  4. Elevated sedimentation rate  R70.0 Mpo/pr-3 (anca) antibodies    CANCELED: Mpo/pr-3 (anca) antibodies   Check ANCA as above  5. Other chronic sinusitis  J32.8    Consider referral to ENT    Orders Placed This Encounter  Procedures  . DG Chest 2 View    Standing Status:   Future    Number of Occurrences:   1    Standing Expiration Date:   08/02/2020    Order Specific Question:   Reason for Exam (SYMPTOM  OR DIAGNOSIS REQUIRED)    Answer:   dyspnea    Order Specific Question:   Preferred imaging location?    Answer:   [facility]    Order Specific Question:   Radiology Contrast Protocol - do NOT remove file path    Answer:   [email or web address]    Order Specific Question:   Is patient pregnant?    Answer:   No  . Mpo/pr-3 (anca)  antibodies    Standing Status:   Future    Number of Occurrences:   1    Standing Expiration Date:   06/02/2020  . AMB REFERRAL FOR DME    Referral Priority:   Routine    Referral Type:   Durable Medical Equipment Purchase    Number of Visits Requested:   1  . Pulmonary Function Test [facility] Only    Standing Status:   Future    Number of Occurrences:   1    Standing Expiration Date:   06/02/2020    Scheduling Instructions:     Urgent    Order Specific Question:   Full PFT: includes the following: basic spirometry, spirometry pre & post bronchodilator, diffusion capacity (DLCO), lung volumes    Answer:   Full PFT    Order Specific Question:   MIP/MEP    Answer:   No    Order Specific Question:   6 minute walk  Answer:   No    Order Specific Question:   ABG    Answer:   No    Order Specific Question:   Diffusion capacity (DLCO)    Answer:   Yes    Order Specific Question:   Lung volumes    Answer:   Yes    Order Specific Question:   Methacholine challenge    Answer:   No    Order Specific Question:   This test can only be performed at    Answer:   [facility]   Will see the patient in follow-up in 2 months time she is to contact us prior to that time should any new difficulties arise.  Gailen Shelter, MD [facility]   *This note was dictated using voice recognition software/Dragon.  Despite best efforts to proofread, errors can occur which can change the meaning.  Any change was purely unintentional.

## 2019-06-03 NOTE — Patient Instructions (Signed)
We are going to put you on a machine and mask for his sleep apnea  We are getting a blood test to evaluate your elevated sed rate (inflammation test)  We are ordering breathing tests  We are ordering a chest x-ray  We will see you in follow-up in 2 months time call sooner should any new problems arise

## 2019-06-06 ENCOUNTER — Other Ambulatory Visit: Payer: Self-pay

## 2019-06-06 ENCOUNTER — Other Ambulatory Visit
Admission: RE | Admit: 2019-06-06 | Discharge: 2019-06-06 | Disposition: A | Discharge status: Home / Self Care | Payer: Medicare Other | Source: Ambulatory Visit | Attending: Pulmonary Disease | Admitting: Pulmonary Disease

## 2019-06-06 DIAGNOSIS — Z20822 Contact with and (suspected) exposure to covid-19: Secondary | ICD-10-CM | POA: Insufficient documentation

## 2019-06-06 DIAGNOSIS — Z01812 Encounter for preprocedural laboratory examination: Secondary | ICD-10-CM | POA: Diagnosis present

## 2019-06-06 LAB — SARS CORONAVIRUS 2 (TAT 6-24 HRS): SARS Coronavirus 2: NEGATIVE

## 2019-06-09 ENCOUNTER — Ambulatory Visit
Admission: RE | Admit: 2019-06-09 | Discharge: 2019-06-09 | Disposition: A | Discharge status: Home / Self Care | Payer: Medicare Other | Source: Ambulatory Visit | Attending: Pulmonary Disease | Admitting: Pulmonary Disease

## 2019-06-09 ENCOUNTER — Ambulatory Visit: Payer: Medicare Other

## 2019-06-09 ENCOUNTER — Other Ambulatory Visit
Admission: RE | Admit: 2019-06-09 | Discharge: 2019-06-09 | Disposition: A | Discharge status: Home / Self Care | Payer: Medicare Other | Source: Ambulatory Visit | Attending: Pulmonary Disease | Admitting: Pulmonary Disease

## 2019-06-09 DIAGNOSIS — R700 Elevated erythrocyte sedimentation rate: Secondary | ICD-10-CM | POA: Diagnosis present

## 2019-06-09 DIAGNOSIS — J984 Other disorders of lung: Secondary | ICD-10-CM | POA: Insufficient documentation

## 2019-06-09 DIAGNOSIS — R0602 Shortness of breath: Secondary | ICD-10-CM

## 2019-06-10 ENCOUNTER — Telehealth: Payer: Self-pay | Admitting: Pulmonary Disease

## 2019-06-10 NOTE — Telephone Encounter (Signed)
Called and spoke with patient about her recent CXR results. Patient will do breathing test tomorrow. Nothing further needed at this time

## 2019-06-11 ENCOUNTER — Other Ambulatory Visit: Payer: Self-pay

## 2019-06-11 ENCOUNTER — Ambulatory Visit: Payer: Medicare Other | Attending: Pulmonary Disease

## 2019-06-11 DIAGNOSIS — R0602 Shortness of breath: Secondary | ICD-10-CM

## 2019-06-11 DIAGNOSIS — J984 Other disorders of lung: Secondary | ICD-10-CM

## 2019-06-11 LAB — MPO/PR-3 (ANCA) ANTIBODIES
ANCA Proteinase 3: <3.5 U/mL (ref 0.0–3.5)
Myeloperoxidase Abs: <9 U/mL (ref 0.0–9.0)

## 2019-06-11 MED ORDER — ALBUTEROL SULFATE (2.5 MG/3ML) 0.083% IN NEBU
2.5000 mg | INHALATION_SOLUTION | Freq: Once | RESPIRATORY_TRACT | Status: AC
  Administered 2019-06-11: 2.5 mg via RESPIRATORY_TRACT
  Filled 2019-06-11: qty 3

## 2019-06-13 ENCOUNTER — Ambulatory Visit: Payer: Self-pay | Admitting: *Deleted

## 2019-06-13 NOTE — Telephone Encounter (Signed)
She called in c/o having muscle spasms in leg arm, right arm and waist.   She wanted to know if she could take the Cyclobenzaprine with her other medications because some of them had been changed around recently.   She called her neurologist, Dr. Malvin Johns and talked with his nurse this morning about this.   I advised her to call her pharmacist to let her know if there is a contraindication to taking the cyclobenzaprine with her other medications.   "The cyclobenzaprine helps the muscle spasms I just didn't know if I could take it or not with my other medications".   She was agreeable to calling her pharmacist for advice pertaining to the compatibility of her medications and the cyclobenzaprine.   I let her know if her spasms got worse to go to the ED.  She was agreeable and said,  "That's where I will go if I get worse".     Reason for Disposition . Body pains are a chronic symptom (recurrent or ongoing AND present > 4 weeks)  Answer Assessment - Initial Assessment Questions 1. ONSET: "When did the muscle aches or body pains start?"      I'm having muscle spasms in my left arm and my waist.  She talked with her neurologist's nurse this morning about taking medicine for the spasms.   "I feel like my body is locking up". 2. LOCATION: "What part of your body is hurting?" (e.g., entire body, arms, legs)      Having spasms in left arm, some in right arm and in her waist. 3. SEVERITY: "How bad is the pain?" (Scale 1-10; or mild, moderate, severe)   - MILD (1-3): doesn't interfere with normal activities    - MODERATE (4-7): interferes with normal activities or awakens from sleep    - SEVERE (8-10):  excruciating pain, unable to do any normal activities      I feel like my body is "locking up".   "This has happened before". 4. CAUSE: "What do you think is causing the pains?"     I have these muscle spasms.  I take cyclobenzaprine for the spasms and it works good.   I just don't know if I can take it  with my other medications.   She mentioned one of her doctors had changed her medications around. 5. FEVER: "Have you been having fever?"     Not asked 6. OTHER SYMPTOMS: "Do you have any other symptoms?" (e.g., chest pain, weakness, rash, cold or flu symptoms, weight loss)     No 7. PREGNANCY: "Is there any chance you are pregnant?" "When was your last menstrual period?"     Not asked 8. TRAVEL: "Have you traveled out of the country in the last month?" (e.g., travel history, exposures)     Not asked  Protocols used: MUSCLE ACHES AND BODY PAIN-A-AH

## 2019-06-16 ENCOUNTER — Telehealth: Payer: Self-pay | Admitting: Internal Medicine

## 2019-06-16 NOTE — Telephone Encounter (Signed)
Error

## 2019-06-18 ENCOUNTER — Encounter: Payer: Self-pay | Admitting: Internal Medicine

## 2019-07-04 NOTE — Telephone Encounter (Signed)
Dr. Jayme Cloud, pt is asking for PFT results and wanted to let you know she just got her CPAP machine. Please advise results, thanks!

## 2019-07-08 NOTE — Telephone Encounter (Signed)
Her breathing test show that she just does not take as deep of breath is most people.  But otherwise no major abnormalities.  Her blood tests were okay.  We will discuss further when she follows up.

## 2019-07-25 ENCOUNTER — Encounter: Payer: Self-pay | Admitting: Internal Medicine

## 2019-07-25 ENCOUNTER — Other Ambulatory Visit: Payer: Self-pay

## 2019-07-25 ENCOUNTER — Ambulatory Visit (INDEPENDENT_AMBULATORY_CARE_PROVIDER_SITE_OTHER): Payer: Medicare Other | Admitting: Internal Medicine

## 2019-07-25 VITALS — BP 136/84 | HR 101 | Temp 98.4°F | Ht 67.0 in | Wt 272.0 lb

## 2019-07-25 DIAGNOSIS — G4733 Obstructive sleep apnea (adult) (pediatric): Secondary | ICD-10-CM | POA: Diagnosis not present

## 2019-07-25 DIAGNOSIS — I6523 Occlusion and stenosis of bilateral carotid arteries: Secondary | ICD-10-CM | POA: Diagnosis not present

## 2019-07-25 DIAGNOSIS — I10 Essential (primary) hypertension: Secondary | ICD-10-CM | POA: Diagnosis not present

## 2019-07-25 DIAGNOSIS — Z6841 Body Mass Index (BMI) 40.0 and over, adult: Secondary | ICD-10-CM | POA: Diagnosis not present

## 2019-07-25 NOTE — Progress Notes (Signed)
Date:  07/25/2019   Name:  Adrienne Young   DOB:  [birth year]   MRN:  [id number]   Chief Complaint: Hypertension (Follow up.), Obesity (Weight increasing.), and sleeping too much (Says she falls asleep at random times, even when driving. )  Hypertension This is a chronic problem. The problem is controlled. Pertinent negatives include no chest pain, headaches, palpitations or shortness of breath. Past treatments include beta blockers. The current treatment provides significant improvement.  OSA - pt apparently did not get any CPAP supplies.  She had the sleep study and equipment was ordered.  It was delivered to her home but she would not accept it without signing so she does not have it.  She will discuss with her Pulmonologist next week.  Lab Results  Component Value Date   CREATININE 0.9 03/12/2019   BUN 14 03/12/2019   NA 140 03/12/2019   K 4.5 03/12/2019   CL 104 06/14/2017   CO2 28 (A) 06/14/2017   Lab Results  Component Value Date   CHOL 222 (A) 12/21/2016   HDL 67 12/21/2017   LDLCALC 142 12/21/2017   TRIG 67 12/21/2016   Lab Results  Component Value Date   TSH 3.04 03/12/2019   Lab Results  Component Value Date   HGBA1C 6.1 03/12/2019   Lab Results  Component Value Date   WBC 7.0 03/12/2019   HGB 13.2 03/12/2019   HCT 42 06/14/2017   PLT 317 03/12/2019   Lab Results  Component Value Date   ALT 12 03/12/2019   AST 15 03/12/2019   ALKPHOS 86 03/12/2019     Review of Systems  Constitutional: Positive for chills, fatigue, fever and unexpected weight change.  Eyes: Negative for visual disturbance.  Respiratory: Negative for choking, shortness of breath and wheezing.   Cardiovascular: Negative for chest pain, palpitations and leg swelling.  Gastrointestinal: Negative for abdominal pain.  Musculoskeletal: Negative for arthralgias, gait problem and joint swelling.  Neurological: Negative for dizziness, light-headedness and headaches.    Psychiatric/Behavioral: Negative for dysphoric mood and sleep disturbance. The patient is not nervous/anxious.     Patient Active Problem List   Diagnosis Date Noted  . Prediabetes 04/22/2019  . Numbness of left hand 04/03/2019  . Shortness of breath 03/19/2019  . Diastolic dysfunction 03/19/2019  . Gastroesophageal reflux disease 03/19/2019  . Obesity (BMI 35.0-39.9 without comorbidity) 03/19/2019  . Vitamin B12 deficiency 03/14/2019  . Pain of left side of body 03/14/2019  . Muscle spasm 03/14/2019  . Drooping eyelid, left 03/14/2019  . Anesthesia of skin 03/14/2019  . Vitamin D deficiency 03/04/2019  . Bilateral carotid artery stenosis 02/19/2019  . Obstructive sleep apnea 07/20/2017  . Restrictive lung disease 05/28/2017  . Palpitations 01/21/2015  . Abnormal ambulatory electrocardiogram 01/21/2015  . Other allergic rhinitis 11/14/2013  . Left-sided low back pain with left-sided sciatica 11/14/2013  . Essential hypertension 11/14/2013    Allergies  Allergen Reactions  . Dexamethasone Anaphylaxis    Difficulty breathing  . Prednisone Anaphylaxis    Diff breathing  . Sulfa Antibiotics Other (See Comments)    Past Surgical History:  Procedure Laterality Date  . ABDOMINAL HYSTERECTOMY  2006   ovaries still present  . CYST REMOVAL TRUNK  1999    Social History   Tobacco Use  . Smoking status: Never Smoker  . Smokeless tobacco: Never Used  Vaping Use  . Vaping Use: Never used  Substance Use Topics  . Alcohol use: Never  . Drug use:  Never     Medication list has been reviewed and updated.  Current Meds  Medication Sig  . albuterol (VENTOLIN HFA) 108 (90 Base) MCG/ACT inhaler Inhale 2 puffs into the lungs every 6 (six) hours as needed for wheezing or shortness of breath.  . clonazePAM (KLONOPIN) 0.25 MG disintegrating tablet Take 1 tablet by mouth 2 (two) times daily as needed. Dr. Malvin Johns  . Cyanocobalamin 5000 MCG CAPS Take by mouth.  . cyclobenzaprine  (FLEXERIL) 10 MG tablet Take 10 mg by mouth 3 (three) times daily as needed for muscle spasms. All over muscle spasms.  . divalproex (DEPAKOTE) 125 MG DR tablet Take 125 mg by mouth in the morning and at bedtime. Supposed to take 250 BID per Dr Malvin Johns but causes her to be sick.  . metoprolol succinate (TOPROL-XL) 25 MG 24 hr tablet Take 1 tablet (25 mg total) by mouth daily.  . pantoprazole (PROTONIX) 40 MG tablet Take 1 tablet (40 mg total) by mouth daily. (Patient taking differently: Take 40 mg by mouth daily. Dr Jayme Cloud)    [facility] 2/9 Scores 07/25/2019 05/20/2019 04/22/2019 02/19/2019  PHQ - 2 Score 0 1 0 0  PHQ- 9 Score 5 9 - 0    GAD 7 : Generalized Anxiety Score 07/25/2019 05/20/2019  Nervous, Anxious, on Edge 1 2  Control/stop worrying 1 0  Worry too much - different things 1 0  Trouble relaxing 2 3  Restless 1 3  Easily annoyed or irritable 1 0  Afraid - awful might happen 0 0  Total GAD 7 Score 7 8  Anxiety Difficulty Not difficult at all Not difficult at all    BP Readings from Last 3 Encounters:  07/25/19 136/84  06/03/19 128/80  05/20/19 118/70    Physical Exam Vitals and nursing note reviewed.  Constitutional:      General: She is not in acute distress.    Appearance: Normal appearance. She is well-developed.  HENT:     Head: Normocephalic and atraumatic.  Cardiovascular:     Rate and Rhythm: Normal rate and regular rhythm.     Pulses: Normal pulses.     Heart sounds: Murmur heard.   Pulmonary:     Effort: Pulmonary effort is normal. No respiratory distress.     Breath sounds: No wheezing or rhonchi.  Musculoskeletal:     Cervical back: Normal range of motion.     Right lower leg: No edema.     Left lower leg: No edema.  Lymphadenopathy:     Cervical: No cervical adenopathy.  Skin:    General: Skin is warm and dry.     Findings: No rash.  Neurological:     General: No focal deficit present.     Mental Status: She is alert and oriented to person, place, and  time.  Psychiatric:        Mood and Affect: Mood normal.        Speech: Speech is tangential.        Behavior: Behavior normal.        Thought Content: Thought content normal.     Wt Readings from Last 3 Encounters:  07/25/19 272 lb (123.4 kg)  06/03/19 266 lb 12.8 oz (121 kg)  05/20/19 263 lb (119.3 kg)    BP 136/84 (BP Location: Left Arm, Patient Position: Sitting, Cuff Size: Large)   Pulse (!) 101   Temp 98.4 F (36.9 C) (Oral)   Ht 5\' 7"  (1.702 m)   Wt 272 lb (  123.4 kg)   SpO2 98%   BMI 42.60 kg/m   Assessment and Plan: 1. Essential hypertension Clinically stable exam with well controlled BP on metoprolol. Tolerating medications without side effects at this time. Pt to continue current regimen and low sodium diet; benefits of regular exercise as able discussed.  2. Obstructive sleep apnea Apparently does qualify for CPAP but she has declined the delivery for unclear reasons. She will discuss the study findings with Dr. Patsey Berthold next week.  3. BMI 40.0-44.9, adult [facility]) I recommend MyFitnessPal phone app to help track calories and activity   Partially dictated using Dragon software. Any errors are unintentional.  Halina Maidens, MD [facility]  07/25/2019

## 2019-07-25 NOTE — Patient Instructions (Signed)
My fitness Pal - to track calories, water intake and exercise

## 2019-07-31 ENCOUNTER — Encounter: Payer: Self-pay | Admitting: Pulmonary Disease

## 2019-07-31 ENCOUNTER — Ambulatory Visit: Payer: Medicare Other | Admitting: Pulmonary Disease

## 2019-07-31 ENCOUNTER — Other Ambulatory Visit: Payer: Self-pay

## 2019-07-31 VITALS — BP 122/74 | HR 100 | Temp 97.9°F | Ht 67.0 in | Wt 272.4 lb

## 2019-07-31 DIAGNOSIS — R0602 Shortness of breath: Secondary | ICD-10-CM

## 2019-07-31 DIAGNOSIS — J984 Other disorders of lung: Secondary | ICD-10-CM

## 2019-07-31 DIAGNOSIS — I5189 Other ill-defined heart diseases: Secondary | ICD-10-CM

## 2019-07-31 DIAGNOSIS — G4733 Obstructive sleep apnea (adult) (pediatric): Secondary | ICD-10-CM

## 2019-07-31 NOTE — Progress Notes (Signed)
    Assessment & Plan:  1. Obstructive sleep apnea (Primary) - AMB REFERRAL FOR DME  2. Shortness of breath  3. Restrictive lung disease  4. Diastolic dysfunction   Patient Instructions  What we discussed today:  Your lung test show that your lungs are okay Most of the breathing difficulty is from increased weight You do have stiffness of your heart likely related to high blood pressure and obstructive sleep apnea Treating your sleep apnea should make you feel better even during the day We will assign you to the sleep clinic side of the practice so they can follow these complex issues for you   Please note: late entry documentation due to logistical difficulties during COVID-19 pandemic. This note is filed for information purposes only, and is not intended to be used for billing, nor does it represent the full scope/nature of the visit in question. Please see any associated scanned media linked to date of encounter for additional pertinent information.  Subjective:    HPI: Adrienne Young is a 54 y.o. female presenting to the pulmonology clinic on 07/31/2019 with report of: Follow-up (not currently wearing cpap. c/o sob with exertion and at rest, prod cough with yellow mucus and nasal drainage yellow in color)     Outpatient Encounter Medications as of 07/31/2019  Medication Sig Note   [DISCONTINUED] albuterol  (VENTOLIN  HFA) 108 (90 Base) MCG/ACT inhaler Inhale 2 puffs into the lungs every 6 (six) hours as needed for wheezing or shortness of breath.    [DISCONTINUED] clonazePAM (KLONOPIN) 0.25 MG disintegrating tablet Take 1 tablet by mouth 2 (two) times daily as needed. Dr. Lane (Patient not taking: Reported on 04/26/2020)    [DISCONTINUED] Cyanocobalamin 5000 MCG CAPS Take by mouth. (Patient not taking: Reported on 04/26/2020)    [DISCONTINUED] cyclobenzaprine (FLEXERIL) 10 MG tablet Take 10 mg by mouth 3 (three) times daily as needed for muscle spasms. All over muscle spasms.  (Patient not taking: Reported on 04/26/2020)    [DISCONTINUED] divalproex (DEPAKOTE) 125 MG DR tablet Take 125 mg by mouth in the morning and at bedtime. Supposed to take 250 BID per Dr Lane but causes her to be sick.    [DISCONTINUED] metoprolol  succinate (TOPROL -XL) 25 MG 24 hr tablet Take 1 tablet (25 mg total) by mouth daily. (Patient not taking: Reported on 01/26/2020) 01/26/2020: muscle spasm   [DISCONTINUED] pantoprazole  (PROTONIX ) 40 MG tablet Take 1 tablet (40 mg total) by mouth daily. (Patient taking differently: Take 40 mg by mouth daily. Dr Tamea)    No facility-administered encounter medications on file as of 07/31/2019.      Objective:   Vitals:   07/31/19 1144  BP: 122/74  Pulse: 100  Temp: 97.9 F (36.6 C)  Height: 5' 7 (1.702 m)  Weight: 272 lb 6.4 oz (123.6 kg)  SpO2: 96%  TempSrc: Temporal  BMI (Calculated): 42.65     Physical exam documentation is limited by delayed entry of information.

## 2019-07-31 NOTE — Patient Instructions (Signed)
What we discussed today:   Your lung test show that your lungs are okay  Most of the breathing difficulty is from increased weight  You do have stiffness of your heart likely related to high blood pressure and obstructive sleep apnea  Treating your sleep apnea should make you feel better even during the day  We will assign you to the sleep clinic side of the practice so they can follow these complex issues for you

## 2019-08-07 ENCOUNTER — Other Ambulatory Visit: Payer: Self-pay | Admitting: Pulmonary Disease

## 2019-08-12 ENCOUNTER — Encounter: Payer: Self-pay | Admitting: Internal Medicine

## 2019-08-12 ENCOUNTER — Other Ambulatory Visit: Payer: Self-pay | Admitting: Internal Medicine

## 2019-08-12 ENCOUNTER — Other Ambulatory Visit: Payer: Self-pay

## 2019-08-12 NOTE — Telephone Encounter (Signed)
Routing to Dr. Gonzalez as an FYI 

## 2019-08-13 NOTE — Telephone Encounter (Signed)
The masks for CPAP normally just cover the nose.  Rarely patients need to have a full facemask.  She is correct and that distilled water is the only water that should be used in the humidifier set up this is to avoid infection.

## 2019-08-13 NOTE — Telephone Encounter (Signed)
She should also be set up with sleep physician follow-up as sleep apnea is her main issue.

## 2019-09-07 ENCOUNTER — Other Ambulatory Visit: Payer: Self-pay | Admitting: Internal Medicine

## 2019-09-07 NOTE — Telephone Encounter (Signed)
Requested Prescriptions  Pending Prescriptions Disp Refills  . pantoprazole (PROTONIX) 40 MG tablet [Pharmacy Med Name: PANTOPRAZOLE SOD DR 40 MG TAB] 90 tablet 0    Sig: TAKE 1 TABLET BY MOUTH EVERY DAY     There is no refill protocol information for this order

## 2019-10-06 ENCOUNTER — Telehealth: Payer: Self-pay | Admitting: Internal Medicine

## 2019-10-06 NOTE — Telephone Encounter (Signed)
NO ANSWER. NO VM. Pt due to schedule Medicare Annual Wellness Visit (AWV) either virtually/audio only or in office. Whichever the patients preference is.  No history of AWV; please schedule at anytime with [facility] Advisor.  This should be a 40 minute visit  AWV-I PER PALMETTO

## 2019-10-09 ENCOUNTER — Ambulatory Visit: Payer: Medicare Other | Admitting: Pulmonary Disease

## 2019-10-19 ENCOUNTER — Other Ambulatory Visit: Payer: Self-pay | Admitting: Internal Medicine

## 2019-10-19 DIAGNOSIS — J984 Other disorders of lung: Secondary | ICD-10-CM

## 2019-10-19 NOTE — Telephone Encounter (Signed)
Requested Prescriptions  Pending Prescriptions Disp Refills  . albuterol (VENTOLIN HFA) 108 (90 Base) MCG/ACT inhaler [Pharmacy Med Name: ALBUTEROL HFA (PROAIR) INHALER] 8.5 each 2    Sig: INHALE 2 PUFFS EVERY 6 HOURS AS NEEDED FOR WHEEZING FOR SHORTNESS OF BREATH     Pulmonology:  Beta Agonists Failed - 10/19/2019  8:29 AM      Failed - One inhaler should last at least one month. If the patient is requesting refills earlier, contact the patient to check for uncontrolled symptoms.      Passed - Valid encounter within last 12 months    Recent Outpatient Visits          2 months ago Essential hypertension   [facility] Reubin Milan, MD   5 months ago Essential hypertension   [facility] Reubin Milan, MD   6 months ago Essential hypertension   [facility] Reubin Milan, MD   8 months ago Restrictive lung disease   [facility] Reubin Milan, MD      Future Appointments            In 3 months Judithann Graves Nyoka Cowden, MD [facility]

## 2019-12-02 ENCOUNTER — Encounter: Payer: Self-pay | Admitting: Internal Medicine

## 2019-12-02 DIAGNOSIS — Z90710 Acquired absence of both cervix and uterus: Secondary | ICD-10-CM | POA: Insufficient documentation

## 2019-12-07 ENCOUNTER — Other Ambulatory Visit: Payer: Self-pay | Admitting: Internal Medicine

## 2019-12-07 NOTE — Telephone Encounter (Signed)
Requested medication (s) are due for refill today: yes  Requested medication (s) are on the active medication list: yes  Last refill:  09/07/19  Future visit scheduled: yes  Notes to clinic:  no refill protocol for this med   Requested Prescriptions  Pending Prescriptions Disp Refills   pantoprazole (PROTONIX) 40 MG tablet [Pharmacy Med Name: PANTOPRAZOLE SOD DR 40 MG TAB] 90 tablet 0    Sig: TAKE 1 TABLET BY MOUTH EVERY DAY      There is no refill protocol information for this order

## 2020-01-26 ENCOUNTER — Ambulatory Visit (INDEPENDENT_AMBULATORY_CARE_PROVIDER_SITE_OTHER): Payer: Medicare Other | Admitting: Internal Medicine

## 2020-01-26 ENCOUNTER — Encounter: Payer: Self-pay | Admitting: Internal Medicine

## 2020-01-26 ENCOUNTER — Other Ambulatory Visit: Payer: Self-pay

## 2020-01-26 VITALS — BP 160/98 | HR 106 | Temp 98.1°F | Ht 67.0 in | Wt 277.0 lb

## 2020-01-26 DIAGNOSIS — I6523 Occlusion and stenosis of bilateral carotid arteries: Secondary | ICD-10-CM | POA: Diagnosis not present

## 2020-01-26 DIAGNOSIS — G4733 Obstructive sleep apnea (adult) (pediatric): Secondary | ICD-10-CM | POA: Diagnosis not present

## 2020-01-26 DIAGNOSIS — I10 Essential (primary) hypertension: Secondary | ICD-10-CM | POA: Diagnosis not present

## 2020-01-26 DIAGNOSIS — R7303 Prediabetes: Secondary | ICD-10-CM

## 2020-01-26 MED ORDER — BENAZEPRIL HCL 20 MG PO TABS: 20.0000 mg | ORAL_TABLET | Freq: Every day | ORAL | 1 refills | Status: DC

## 2020-01-26 NOTE — Progress Notes (Signed)
Date:  01/26/2020   Name:  Adrienne Young   DOB:  11/07/1965   MRN:  [id number]   Chief Complaint: Hypertension (Metoprolol causes spasm around waist hasn't taken in 2 months, cramping/ tightening/) and Diabetes  Hypertension This is a chronic problem. Pertinent negatives include no chest pain, headaches or shortness of breath. Past treatments include beta blockers (stopped metoprolol several months go due to muscles spams of the torso). Compliance problems include medication side effects.   Diabetes She presents for her follow-up diabetic visit. Diabetes type: prediabetes. Her disease course has been stable. Pertinent negatives for hypoglycemia include no dizziness, headaches or nervousness/anxiousness. Pertinent negatives for diabetes include no chest pain and no fatigue. An ACE inhibitor/angiotensin II receptor blocker is not being taken.    Lab Results  Component Value Date   CREATININE 0.9 03/12/2019   BUN 14 03/12/2019   NA 140 03/12/2019   K 4.5 03/12/2019   CL 104 06/14/2017   CO2 28 (A) 06/14/2017   Lab Results  Component Value Date   CHOL 222 (A) 12/21/2016   HDL 67 12/21/2017   LDLCALC 142 12/21/2017   TRIG 67 12/21/2016   Lab Results  Component Value Date   TSH 3.04 03/12/2019   Lab Results  Component Value Date   HGBA1C 6.1 03/12/2019   Lab Results  Component Value Date   WBC 7.0 03/12/2019   HGB 13.2 03/12/2019   HCT 42 06/14/2017   PLT 317 03/12/2019   Lab Results  Component Value Date   ALT 12 03/12/2019   AST 15 03/12/2019   ALKPHOS 86 03/12/2019     Review of Systems  Constitutional: Negative for chills, fatigue and fever.  Respiratory: Negative for chest tightness and shortness of breath.   Cardiovascular: Negative for chest pain and leg swelling.  Gastrointestinal: Negative for abdominal pain, constipation and diarrhea.  Musculoskeletal: Positive for myalgias.  Neurological: Negative for dizziness and headaches.   Psychiatric/Behavioral: Negative for sleep disturbance. The patient is not nervous/anxious.     Patient Active Problem List   Diagnosis Date Noted  . Status post total hysterectomy and bilateral salpingo-oophorectomy 12/02/2019  . Prediabetes 04/22/2019  . Numbness of left hand 04/03/2019  . Shortness of breath 03/19/2019  . Diastolic dysfunction 03/19/2019  . Gastroesophageal reflux disease 03/19/2019  . Obesity (BMI 35.0-39.9 without comorbidity) 03/19/2019  . Vitamin B12 deficiency 03/14/2019  . Pain of left side of body 03/14/2019  . Muscle spasm 03/14/2019  . Drooping eyelid, left 03/14/2019  . Anesthesia of skin 03/14/2019  . Vitamin D deficiency 03/04/2019  . Bilateral carotid artery stenosis 02/19/2019  . Obstructive sleep apnea 07/20/2017  . Restrictive lung disease 05/28/2017  . Palpitations 01/21/2015  . Abnormal ambulatory electrocardiogram 01/21/2015  . Other allergic rhinitis 11/14/2013  . Left-sided low back pain with left-sided sciatica 11/14/2013  . Essential hypertension 11/14/2013    Allergies  Allergen Reactions  . Dexamethasone Anaphylaxis    Difficulty breathing  . Prednisone Anaphylaxis    Diff breathing  . Sulfa Antibiotics Other (See Comments)    Past Surgical History:  Procedure Laterality Date  . ABDOMINAL HYSTERECTOMY  2006   ovaries still present  . CYST REMOVAL TRUNK  1999    Social History   Tobacco Use  . Smoking status: Never Smoker  . Smokeless tobacco: Never Used  Vaping Use  . Vaping Use: Never used  Substance Use Topics  . Alcohol use: Never  . Drug use: Never  Medication list has been reviewed and updated.  Current Meds  Medication Sig  . clonazePAM (KLONOPIN) 0.25 MG disintegrating tablet Take 1 tablet by mouth 2 (two) times daily as needed. Dr. Malvin Johns  . Cyanocobalamin 5000 MCG CAPS Take by mouth.  . cyclobenzaprine (FLEXERIL) 10 MG tablet Take 10 mg by mouth 3 (three) times daily as needed for muscle spasms.  All over muscle spasms.  . divalproex (DEPAKOTE) 125 MG DR tablet Take 125 mg by mouth in the morning and at bedtime. Supposed to take 250 BID per Dr Malvin Johns but causes her to be sick.  . pantoprazole (PROTONIX) 40 MG tablet TAKE 1 TABLET BY MOUTH EVERY DAY    PHQ 2/9 Scores 01/26/2020 07/25/2019 05/20/2019 04/22/2019  PHQ - 2 Score 0 0 1 0  PHQ- 9 Score 3 5 9  -    GAD 7 : Generalized Anxiety Score 01/26/2020 07/25/2019 05/20/2019  Nervous, Anxious, on Edge 2 1 2   Control/stop worrying 0 1 0  Worry too much - different things 0 1 0  Trouble relaxing 0 2 3  Restless 0 1 3  Easily annoyed or irritable 0 1 0  Afraid - awful might happen 0 0 0  Total GAD 7 Score 2 7 8   Anxiety Difficulty - Not difficult at all Not difficult at all    BP Readings from Last 3 Encounters:  01/26/20 (!) 160/98  07/31/19 122/74  07/25/19 136/84    Physical Exam Vitals and nursing note reviewed.  Constitutional:      General: She is not in acute distress.    Appearance: She is well-developed.  HENT:     Head: Normocephalic and atraumatic.  Cardiovascular:     Rate and Rhythm: Normal rate and regular rhythm.     Pulses: Normal pulses.  Pulmonary:     Effort: Pulmonary effort is normal. No respiratory distress.     Breath sounds: No wheezing or rhonchi.  Musculoskeletal:     Right lower leg: No edema.     Left lower leg: No edema.  Skin:    General: Skin is warm and dry.     Findings: No rash.  Neurological:     General: No focal deficit present.     Mental Status: She is alert and oriented to person, place, and time.  Psychiatric:        Mood and Affect: Mood and affect and mood normal.     Wt Readings from Last 3 Encounters:  01/26/20 277 lb (125.6 kg)  07/31/19 272 lb 6.4 oz (123.6 kg)  07/25/19 272 lb (123.4 kg)    BP (!) 160/98   Pulse (!) 106   Temp 98.1 F (36.7 C) (Oral)   Ht 5\' 7"  (1.702 m)   Wt 277 lb (125.6 kg)   SpO2 99%   BMI 43.38 kg/m   Assessment and Plan: 1.  Essential hypertension Uncontrolled BP off of medication Stop metoprolol; start Benazepril Follow up in 3 months - Comprehensive metabolic panel - benazepril (LOTENSIN) 20 MG tablet; Take 1 tablet (20 mg total) by mouth daily.  Dispense: 90 tablet; Refill: 1  2. Prediabetes Continue low carb diet Reminded to do more exercise as tolerated - Hemoglobin A1c  3. Obstructive sleep apnea Not on treatment She got the CPAP but returned it because she wants it hooked to oxygen, no air. Advised her to contact Pulmonary for change in orders  4. Bilateral carotid artery stenosis Check lipids panel and advise on medication. - Lipid  panel   Partially dictated using Animal nutritionist. Any errors are unintentional.  Bari Edward, MD [facility]  01/26/2020

## 2020-02-03 ENCOUNTER — Telehealth: Payer: Self-pay | Admitting: Internal Medicine

## 2020-02-03 NOTE — Telephone Encounter (Signed)
Left message for pt to call back and schedule Medicare Annual Wellness Visit (AWV) either virtually/audio only or in office. Whichever the patients preference is.  No history of AWV; please schedule at anytime with [facility] Advisor.  This should be a 40 minute visit  AWV-I PER PALMETTO

## 2020-02-26 NOTE — Progress Notes (Signed)
Called pt left VM as a remind to get her blood work done. Pts name was stated on VM.  KP

## 2020-04-26 ENCOUNTER — Other Ambulatory Visit: Payer: Self-pay

## 2020-04-26 ENCOUNTER — Encounter: Payer: Self-pay | Admitting: Internal Medicine

## 2020-04-26 ENCOUNTER — Ambulatory Visit (INDEPENDENT_AMBULATORY_CARE_PROVIDER_SITE_OTHER): Payer: Medicare Other | Admitting: Internal Medicine

## 2020-04-26 VITALS — BP 142/86 | HR 108 | Temp 98.1°F | Ht 67.0 in | Wt 278.0 lb

## 2020-04-26 DIAGNOSIS — Z1159 Encounter for screening for other viral diseases: Secondary | ICD-10-CM | POA: Diagnosis not present

## 2020-04-26 DIAGNOSIS — E559 Vitamin D deficiency, unspecified: Secondary | ICD-10-CM

## 2020-04-26 DIAGNOSIS — E782 Mixed hyperlipidemia: Secondary | ICD-10-CM

## 2020-04-26 DIAGNOSIS — Z6841 Body Mass Index (BMI) 40.0 and over, adult: Secondary | ICD-10-CM | POA: Diagnosis not present

## 2020-04-26 DIAGNOSIS — I10 Essential (primary) hypertension: Secondary | ICD-10-CM | POA: Diagnosis not present

## 2020-04-26 DIAGNOSIS — R7303 Prediabetes: Secondary | ICD-10-CM | POA: Diagnosis not present

## 2020-04-26 DIAGNOSIS — R350 Frequency of micturition: Secondary | ICD-10-CM

## 2020-04-26 LAB — POCT URINALYSIS DIPSTICK
Bilirubin, UA: NEGATIVE
Blood, UA: NEGATIVE
Glucose, UA: NEGATIVE
Ketones, UA: NEGATIVE
Leukocytes, UA: NEGATIVE
Nitrite, UA: NEGATIVE
Protein, UA: POSITIVE — AB
Spec Grav, UA: >=1.03 — AB (ref 1.010–1.025)
Urobilinogen, UA: 0.2 E.U./dL
pH, UA: 6 (ref 5.0–8.0)

## 2020-04-26 MED ORDER — DILTIAZEM HCL ER COATED BEADS 120 MG PO CP24: 120.0000 mg | ORAL_CAPSULE | Freq: Every day | ORAL | 4 refills | Status: AC

## 2020-04-26 NOTE — Progress Notes (Signed)
Date:  04/26/2020   Name:  Adrienne Young   DOB:  08-20-1965   MRN:  [id number]   Chief Complaint: Hypertension  Hypertension This is a chronic problem. Pertinent negatives include no chest pain, headaches, palpitations or shortness of breath. Past treatments include ACE inhibitors (beta blockers not tolerated - started ACEI last visit). The current treatment provides moderate improvement. There is no history of kidney disease, CAD/MI or CVA.  Urinary Frequency  This is a new problem. The current episode started 1 to 4 weeks ago. The problem has been unchanged. The patient is experiencing no pain. There has been no fever. Associated symptoms include frequency and urgency. Pertinent negatives include no hematuria. Associated symptoms comments: Pelvic pressure.  Diabetes She presents for her follow-up diabetic visit. Diabetes type: prediabetes. Her disease course has been stable. Pertinent negatives for hypoglycemia include no dizziness or headaches. Pertinent negatives for diabetes include no chest pain, no fatigue and no weakness. Pertinent negatives for diabetic complications include no CVA. Current diabetic treatment includes diet. She is compliant with treatment most of the time. An ACE inhibitor/angiotensin II receptor blocker is being taken.    Lab Results  Component Value Date   CREATININE 0.9 03/12/2019   BUN 14 03/12/2019   NA 140 03/12/2019   K 4.5 03/12/2019   CL 104 06/14/2017   CO2 28 (A) 06/14/2017   Lab Results  Component Value Date   CHOL 222 (A) 12/21/2016   HDL 67 12/21/2017   LDLCALC 142 12/21/2017   TRIG 67 12/21/2016   Lab Results  Component Value Date   TSH 3.04 03/12/2019   Lab Results  Component Value Date   HGBA1C 6.1 03/12/2019   Lab Results  Component Value Date   WBC 7.0 03/12/2019   HGB 13.2 03/12/2019   HCT 42 06/14/2017   PLT 317 03/12/2019   Lab Results  Component Value Date   ALT 12 03/12/2019   AST 15 03/12/2019   ALKPHOS 86  03/12/2019   Last vitamin D Lab Results  Component Value Date   VD25OH 20 06/14/2017   Lab Results  Component Value Date   VITAMINB12 265 03/12/2019     Review of Systems  Constitutional: Negative for fatigue and unexpected weight change.  HENT: Negative for nosebleeds and trouble swallowing.   Eyes: Negative for visual disturbance.  Respiratory: Negative for cough, chest tightness, shortness of breath and wheezing.   Cardiovascular: Negative for chest pain, palpitations and leg swelling.  Gastrointestinal: Negative for abdominal pain, constipation and diarrhea.  Genitourinary: Positive for frequency, pelvic pain and urgency. Negative for dysuria and hematuria.  Musculoskeletal: Positive for myalgias (improved when she takes medication with food).  Neurological: Negative for dizziness, weakness, light-headedness and headaches.    Patient Active Problem List   Diagnosis Date Noted  . BMI 40.0-44.9, adult (HCC) 04/26/2020  . Status post total hysterectomy and bilateral salpingo-oophorectomy 12/02/2019  . Prediabetes 04/22/2019  . Numbness of left hand 04/03/2019  . Shortness of breath 03/19/2019  . Diastolic dysfunction 03/19/2019  . Gastroesophageal reflux disease 03/19/2019  . Obesity (BMI 35.0-39.9 without comorbidity) 03/19/2019  . Vitamin B12 deficiency 03/14/2019  . Pain of left side of body 03/14/2019  . Muscle spasm 03/14/2019  . Drooping eyelid, left 03/14/2019  . Anesthesia of skin 03/14/2019  . Vitamin D deficiency 03/04/2019  . Bilateral carotid artery stenosis 02/19/2019  . Obstructive sleep apnea 07/20/2017  . Restrictive lung disease 05/28/2017  . Palpitations 01/21/2015  . Abnormal ambulatory  electrocardiogram 01/21/2015  . Other allergic rhinitis 11/14/2013  . Left-sided low back pain with left-sided sciatica 11/14/2013  . Essential hypertension 11/14/2013    Allergies  Allergen Reactions  . Dexamethasone Anaphylaxis    Difficulty breathing  .  Prednisone Anaphylaxis    Diff breathing  . Sulfa Antibiotics Other (See Comments)    Past Surgical History:  Procedure Laterality Date  . ABDOMINAL HYSTERECTOMY  2006   ovaries still present  . CYST REMOVAL TRUNK  1999    Social History   Tobacco Use  . Smoking status: Never Smoker  . Smokeless tobacco: Never Used  Vaping Use  . Vaping Use: Never used  Substance Use Topics  . Alcohol use: Never  . Drug use: Never     Medication list has been reviewed and updated.  Current Meds  Medication Sig  . benazepril (LOTENSIN) 20 MG tablet Take 1 tablet (20 mg total) by mouth daily.  . cyclobenzaprine (FLEXERIL) 10 MG tablet Take 10 mg by mouth 3 (three) times daily as needed for muscle spasms.  . IBUPROFEN PO Take by mouth as needed.  . pantoprazole (PROTONIX) 40 MG tablet TAKE 1 TABLET BY MOUTH EVERY DAY  . pseudoephedrine-acetaminophen (TYLENOL SINUS) 30-500 MG TABS tablet Take 1 tablet by mouth as needed.    PHQ 2/9 Scores 04/26/2020 01/26/2020 07/25/2019 05/20/2019  PHQ - 2 Score 0 0 0 1  PHQ- 9 Score 3 3 5 9     GAD 7 : Generalized Anxiety Score 04/26/2020 01/26/2020 07/25/2019 05/20/2019  Nervous, Anxious, on Edge 1 2 1 2   Control/stop worrying 0 0 1 0  Worry too much - different things 0 0 1 0  Trouble relaxing 0 0 2 3  Restless 0 0 1 3  Easily annoyed or irritable 0 0 1 0  Afraid - awful might happen 0 0 0 0  Total GAD 7 Score 1 2 7 8   Anxiety Difficulty - - Not difficult at all Not difficult at all    BP Readings from Last 3 Encounters:  04/26/20 (!) 142/92  01/26/20 (!) 160/98  07/31/19 122/74    Physical Exam Vitals and nursing note reviewed.  Constitutional:      General: She is not in acute distress.    Appearance: Normal appearance. She is well-developed.  HENT:     Head: Normocephalic and atraumatic.  Cardiovascular:     Rate and Rhythm: Normal rate and regular rhythm.     Pulses: Normal pulses.     Heart sounds: No murmur heard.   Pulmonary:      Effort: Pulmonary effort is normal. No respiratory distress.     Breath sounds: No wheezing or rhonchi.  Abdominal:     Palpations: Abdomen is soft.     Tenderness: There is no abdominal tenderness.  Musculoskeletal:     Cervical back: Normal range of motion.     Right lower leg: No edema.     Left lower leg: No edema.  Lymphadenopathy:     Cervical: No cervical adenopathy.  Skin:    General: Skin is warm and dry.     Capillary Refill: Capillary refill takes less than 2 seconds.     Findings: No rash.  Neurological:     General: No focal deficit present.     Mental Status: She is alert and oriented to person, place, and time.  Psychiatric:        Mood and Affect: Mood normal.  Behavior: Behavior normal.     Wt Readings from Last 3 Encounters:  04/26/20 278 lb (126.1 kg)  01/26/20 277 lb (125.6 kg)  07/31/19 272 lb 6.4 oz (123.6 kg)    BP (!) 142/92   Pulse (!) 123   Temp 98.1 F (36.7 C) (Oral)   Ht 5\' 7"  (1.702 m)   Wt 278 lb (126.1 kg)   SpO2 92%   BMI 43.54 kg/m   Assessment and Plan: 1. Essential hypertension Not controlled with elevated HR since stopping metoprolol Will add low dose cardiazem CD to lisinopril - Basic metabolic panel - diltiazem (CARDIZEM CD) 120 MG 24 hr capsule; Take 1 capsule (120 mg total) by mouth daily.  Dispense: 30 capsule; Refill: 4  2. Prediabetes Check labs Continue low carb diet - Hemoglobin A1c  3. Need for hepatitis C screening test - Hepatitis C antibody  4. BMI 40.0-44.9, adult (HCC) Diet and weight loss advised  5. Mixed hyperlipidemia Check labs and advise on medication - Lipid panel  6. Vitamin D deficiency Did not tolerate high dose weekly -  - VITAMIN D 25 Hydroxy (Vit-D Deficiency, Fractures)  7. Urinary frequency UA negative - pt advised to consume more fluids on a daily basis   Partially dictated using 11-19-1981. Any errors are unintentional.  Animal nutritionist, MD [facility]  04/26/2020

## 2020-04-27 ENCOUNTER — Telehealth: Payer: Self-pay

## 2020-04-27 LAB — BASIC METABOLIC PANEL
BUN/Creatinine Ratio: 11 (ref 9–23)
BUN: 10 mg/dL (ref 6–24)
CO2: 23 mmol/L (ref 20–29)
Calcium: 9.1 mg/dL (ref 8.7–10.2)
Chloride: 99 mmol/L (ref 96–106)
Creatinine, Ser: 0.89 mg/dL (ref 0.57–1.00)
Glucose: 128 mg/dL — ABNORMAL HIGH (ref 65–99)
Potassium: 4.2 mmol/L (ref 3.5–5.2)
Sodium: 142 mmol/L (ref 134–144)
eGFR: 77 mL/min/{1.73_m2} (ref >59)

## 2020-04-27 LAB — HEPATITIS C ANTIBODY: Hep C Virus Ab: <0.1 s/co ratio (ref 0.0–0.9)

## 2020-04-27 LAB — VITAMIN D 25 HYDROXY (VIT D DEFICIENCY, FRACTURES): Vit D, 25-Hydroxy: 15.2 ng/mL — ABNORMAL LOW (ref 30.0–100.0)

## 2020-04-27 LAB — LIPID PANEL
Chol/HDL Ratio: 3.8 ratio (ref 0.0–4.4)
Cholesterol, Total: 210 mg/dL — ABNORMAL HIGH (ref 100–199)
HDL: 56 mg/dL (ref >39)
LDL Chol Calc (NIH): 144 mg/dL — ABNORMAL HIGH (ref 0–99)
Triglycerides: 57 mg/dL (ref 0–149)
VLDL Cholesterol Cal: 10 mg/dL (ref 5–40)

## 2020-04-27 LAB — HEMOGLOBIN A1C
Est. average glucose Bld gHb Est-mCnc: 151 mg/dL
Hgb A1c MFr Bld: 6.9 % — ABNORMAL HIGH (ref 4.8–5.6)

## 2020-04-27 NOTE — Telephone Encounter (Signed)
Called pt left VM that pt should still follow up wit specialist. Pt name was stated on VM.  KP

## 2020-04-27 NOTE — Telephone Encounter (Signed)
Pt asked if she needs to continue to f/u with Pulmonologist, Neurologist and Rheumatology.

## 2020-04-27 NOTE — Telephone Encounter (Signed)
Yes, she should continue with her usual specialist visits as planned.

## 2020-04-27 NOTE — Progress Notes (Signed)
Please schedule 4 month follow up.  Will route result note to [facility] Nurse Triage for follow up when patient returns call to clinic. Nurse may give results to patient if they return call. CRM created for this message.    KP

## 2020-05-25 ENCOUNTER — Encounter: Payer: Self-pay | Admitting: Pulmonary Disease

## 2020-06-10 NOTE — Telephone Encounter (Signed)
Dr. Gonzalez, please advise. Thanks 

## 2020-06-10 NOTE — Telephone Encounter (Signed)
The last visit with me was in June.  My recommendation was that she start seeing sleep medicine as this is her main issue.  She no-show to her appointment with Dr. Craige Cotta our sleep physician on 2 September.  If calls her for tests or things that she thinks are needed she needs to let us know.  This is always recommended to them.  With regards to sinuses it may be related to allergies.  If she thinks that she is running a fever she will need to be evaluated.  I recommend urgent care.  I do not deal with the pelvic area so I cannot recommend anything but urgent care may be of assistance.

## 2020-07-21 ENCOUNTER — Other Ambulatory Visit: Payer: Self-pay | Admitting: Internal Medicine

## 2020-07-21 ENCOUNTER — Telehealth: Payer: Self-pay | Admitting: Internal Medicine

## 2020-07-21 DIAGNOSIS — I10 Essential (primary) hypertension: Secondary | ICD-10-CM

## 2020-07-21 NOTE — Telephone Encounter (Signed)
Requested Prescriptions  Pending Prescriptions Disp Refills  . benazepril (LOTENSIN) 20 MG tablet [Pharmacy Med Name: BENAZEPRIL HCL 20 MG TABLET] 90 tablet 1    Sig: TAKE 1 TABLET BY MOUTH EVERY DAY     Cardiovascular:  ACE Inhibitors Failed - 07/21/2020 12:58 AM      Failed - Last BP in normal range    BP Readings from Last 1 Encounters:  04/26/20 (!) 142/86         Passed - Cr in normal range and within 180 days    Creatinine, Ser  Date Value Ref Range Status  04/26/2020 0.89 0.57 - 1.00 mg/dL Final         Passed - K in normal range and within 180 days    Potassium  Date Value Ref Range Status  04/26/2020 4.2 3.5 - 5.2 mmol/L Final         Passed - Patient is not pregnant      Passed - Valid encounter within last 6 months    Recent Outpatient Visits          2 months ago Essential hypertension   [facility] Reubin Milan, MD   5 months ago Essential hypertension   [facility] Reubin Milan, MD   12 months ago Essential hypertension   [facility] Reubin Milan, MD   1 year ago Essential hypertension   [facility] Reubin Milan, MD   1 year ago Essential hypertension   [facility] Reubin Milan, MD      Future Appointments            In 1 month Judithann Graves Nyoka Cowden, MD [facility]   In 2 months Judithann Graves, Nyoka Cowden, MD [facility], [facility]

## 2020-07-21 NOTE — Telephone Encounter (Signed)
Copied from CRM [id number]. Topic: Medicare AWV >> Jul 21, 2020  3:04 PM Claudette Laws R wrote: Reason for CRM:  Left message for patient to call back and schedule Medicare Annual Wellness Visit (AWV) in office.   If unable to come into the office for AWV,  please offer to do virtually or by telephone.  No hx of AWV eligible for AWVI as of 07/08/2019 per [employer]  Please schedule at anytime with [facility] Health Advisor.      40 Minutes appointment   Any questions, please call me at [phone]

## 2020-08-27 ENCOUNTER — Ambulatory Visit: Payer: Self-pay | Admitting: Internal Medicine

## 2020-10-12 ENCOUNTER — Encounter: Payer: Medicare Other | Admitting: Internal Medicine

## 2021-06-14 IMAGING — MG DIGITAL SCREENING BILAT W/ TOMO W/ CAD
8 of 14 series · 8 of 40 positions shown · non-contrast
Comparison: Previous exam(s).

CLINICAL DATA: Screening.

EXAM:
DIGITAL SCREENING BILATERAL MAMMOGRAM WITH TOMO AND CAD

[R CC synth-2D]
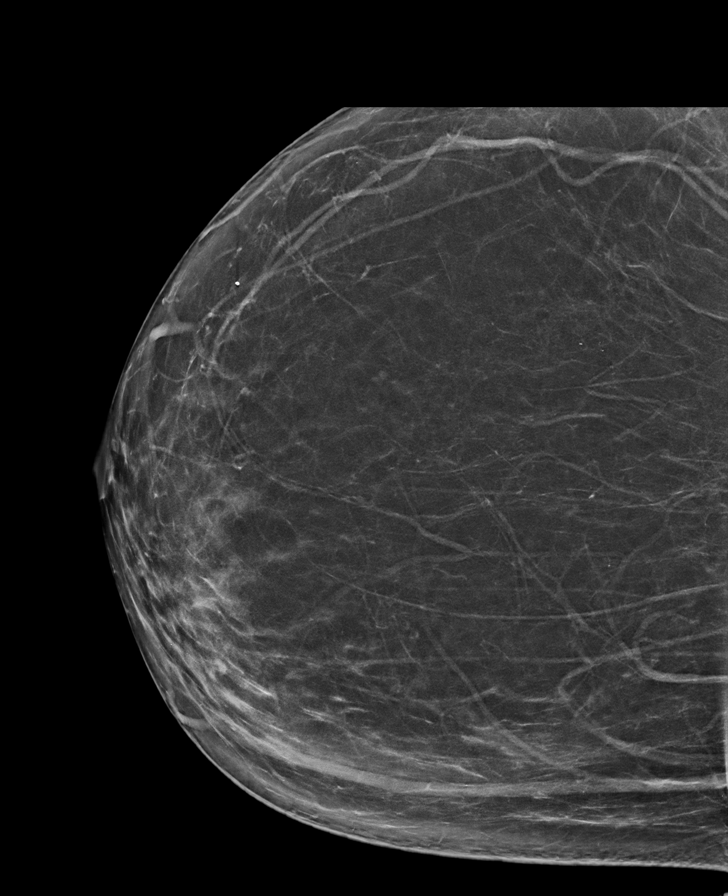

[L CC synth-2D]
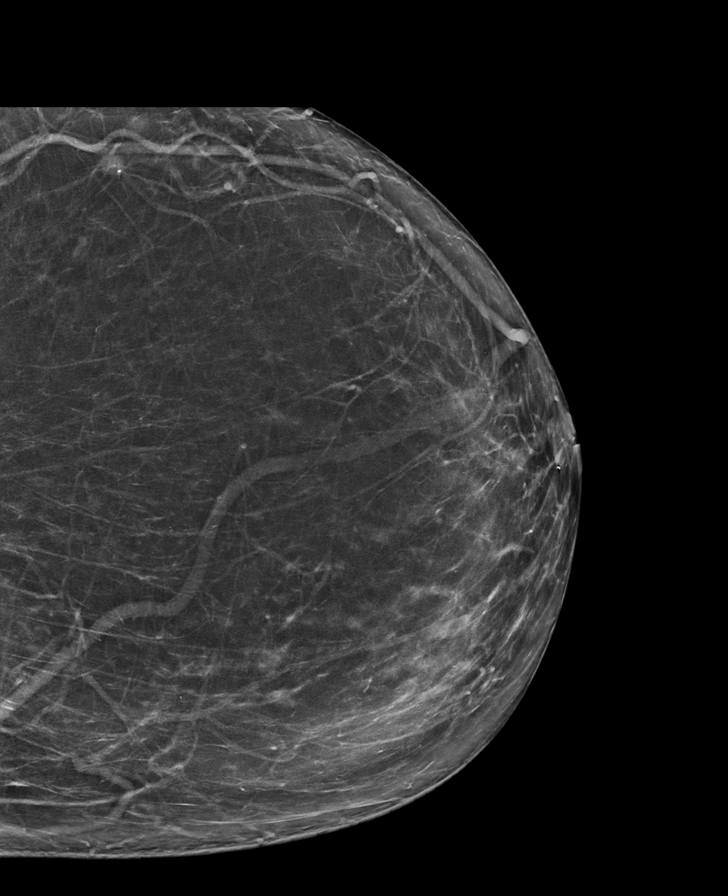

[L MLO synth-2D (1 of 2)]
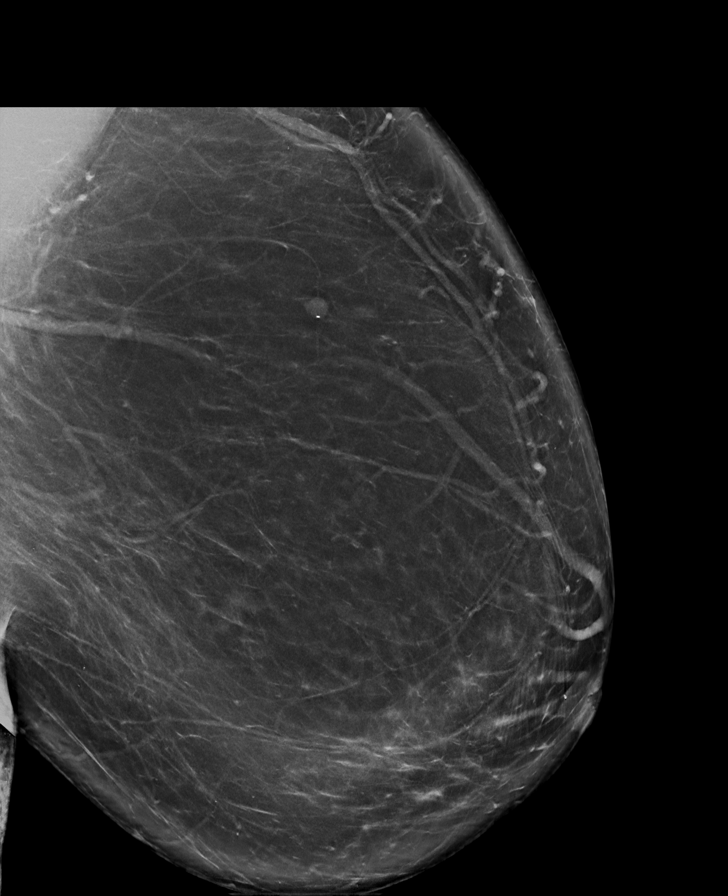

[R MLO synth-2D (1 of 2)]
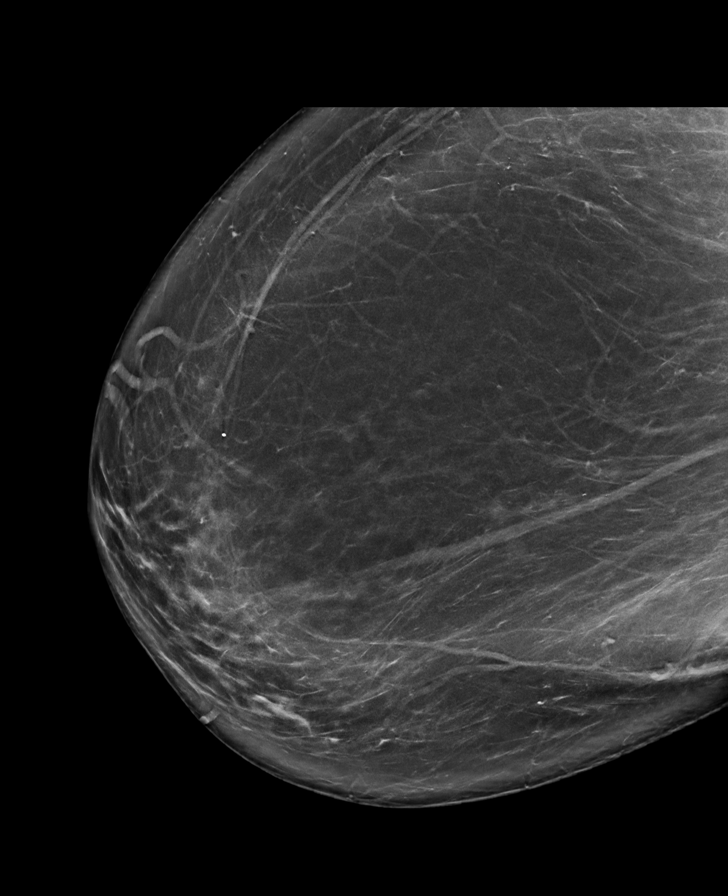

[L MLO synth-2D (2 of 2)]
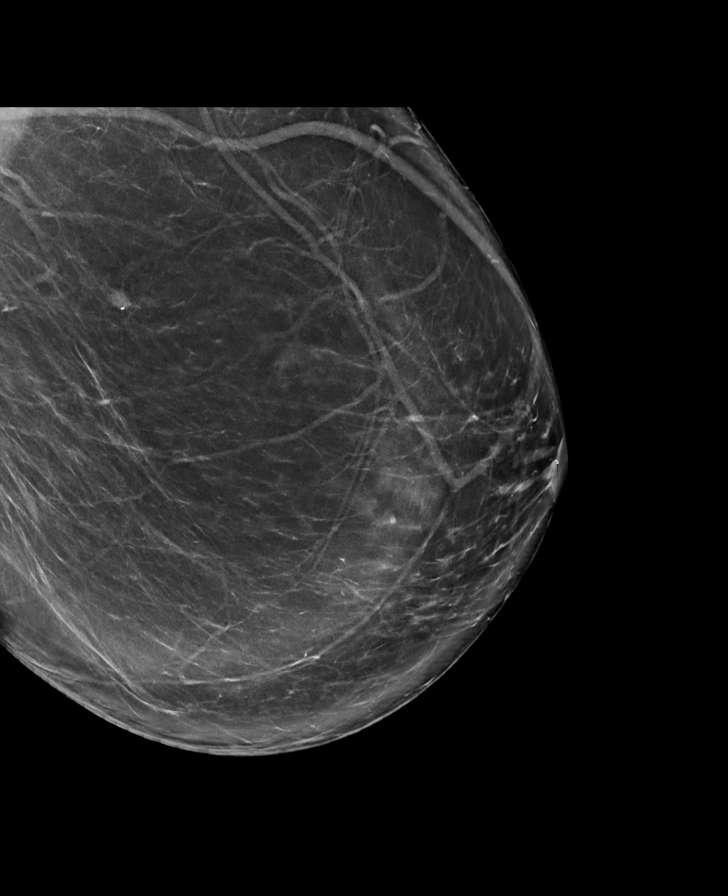

[R MLO synth-2D (2 of 2)]
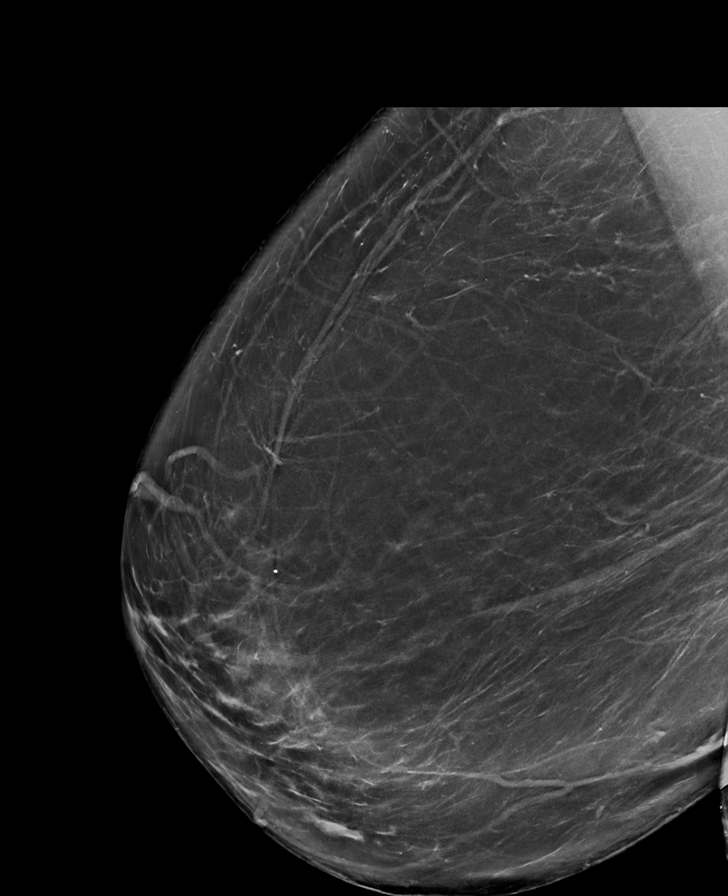

[R XCCL synth-2D]
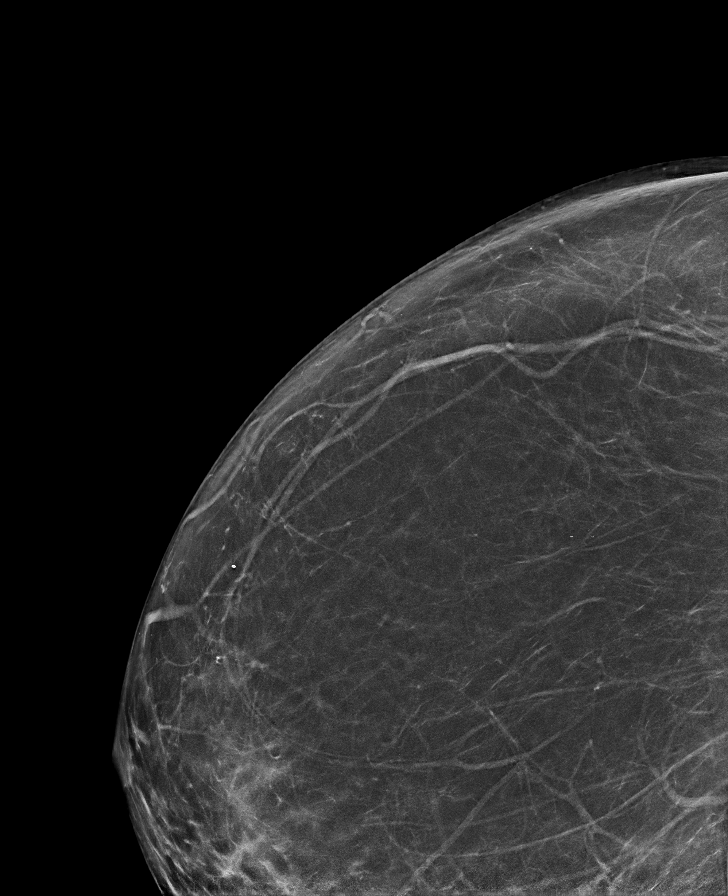

[R CC tomo · tomo slice 44/87.0]
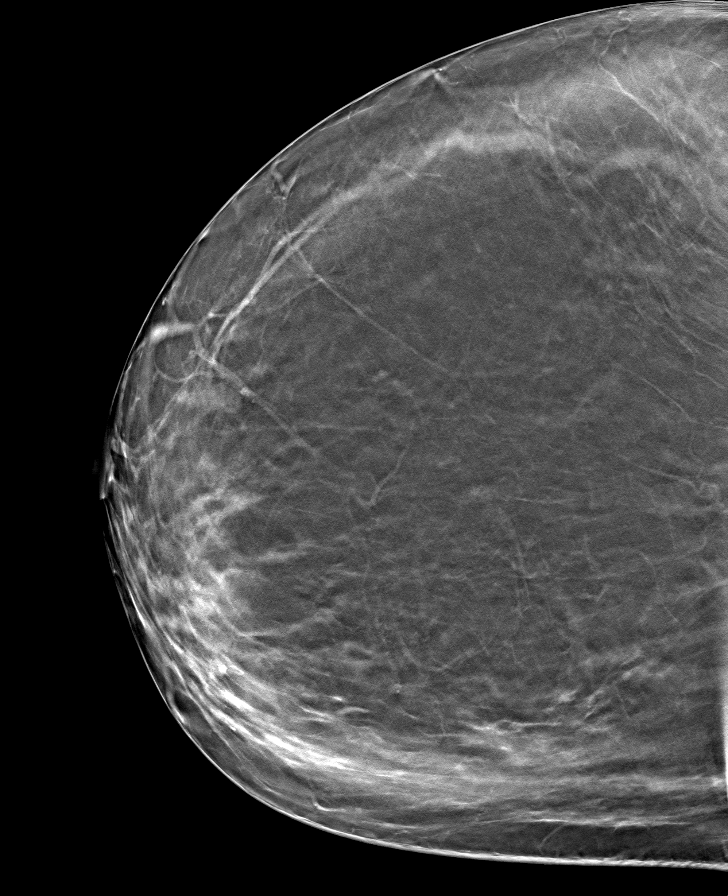

[8 of 40 positions shown; findings below may reference images not displayed]

ACR Breast Density Category b: There are scattered areas of
fibroglandular density.
FINDINGS: There are no findings suspicious for malignancy. Images were
processed with CAD.
IMPRESSION: No mammographic evidence of malignancy. A result letter of this
screening mammogram will be mailed directly to the patient.

RECOMMENDATION:
Screening mammogram in one year. (Code:[id number])

BI-RADS CATEGORY  1: Negative.

## 2021-06-20 IMAGING — CR DG CHEST 2V
2 series · 2 of 2 positions shown · non-contrast
Comparison: None.

CLINICAL DATA: Shortness of breath

EXAM:
CHEST - 2 VIEW

[chest pa]
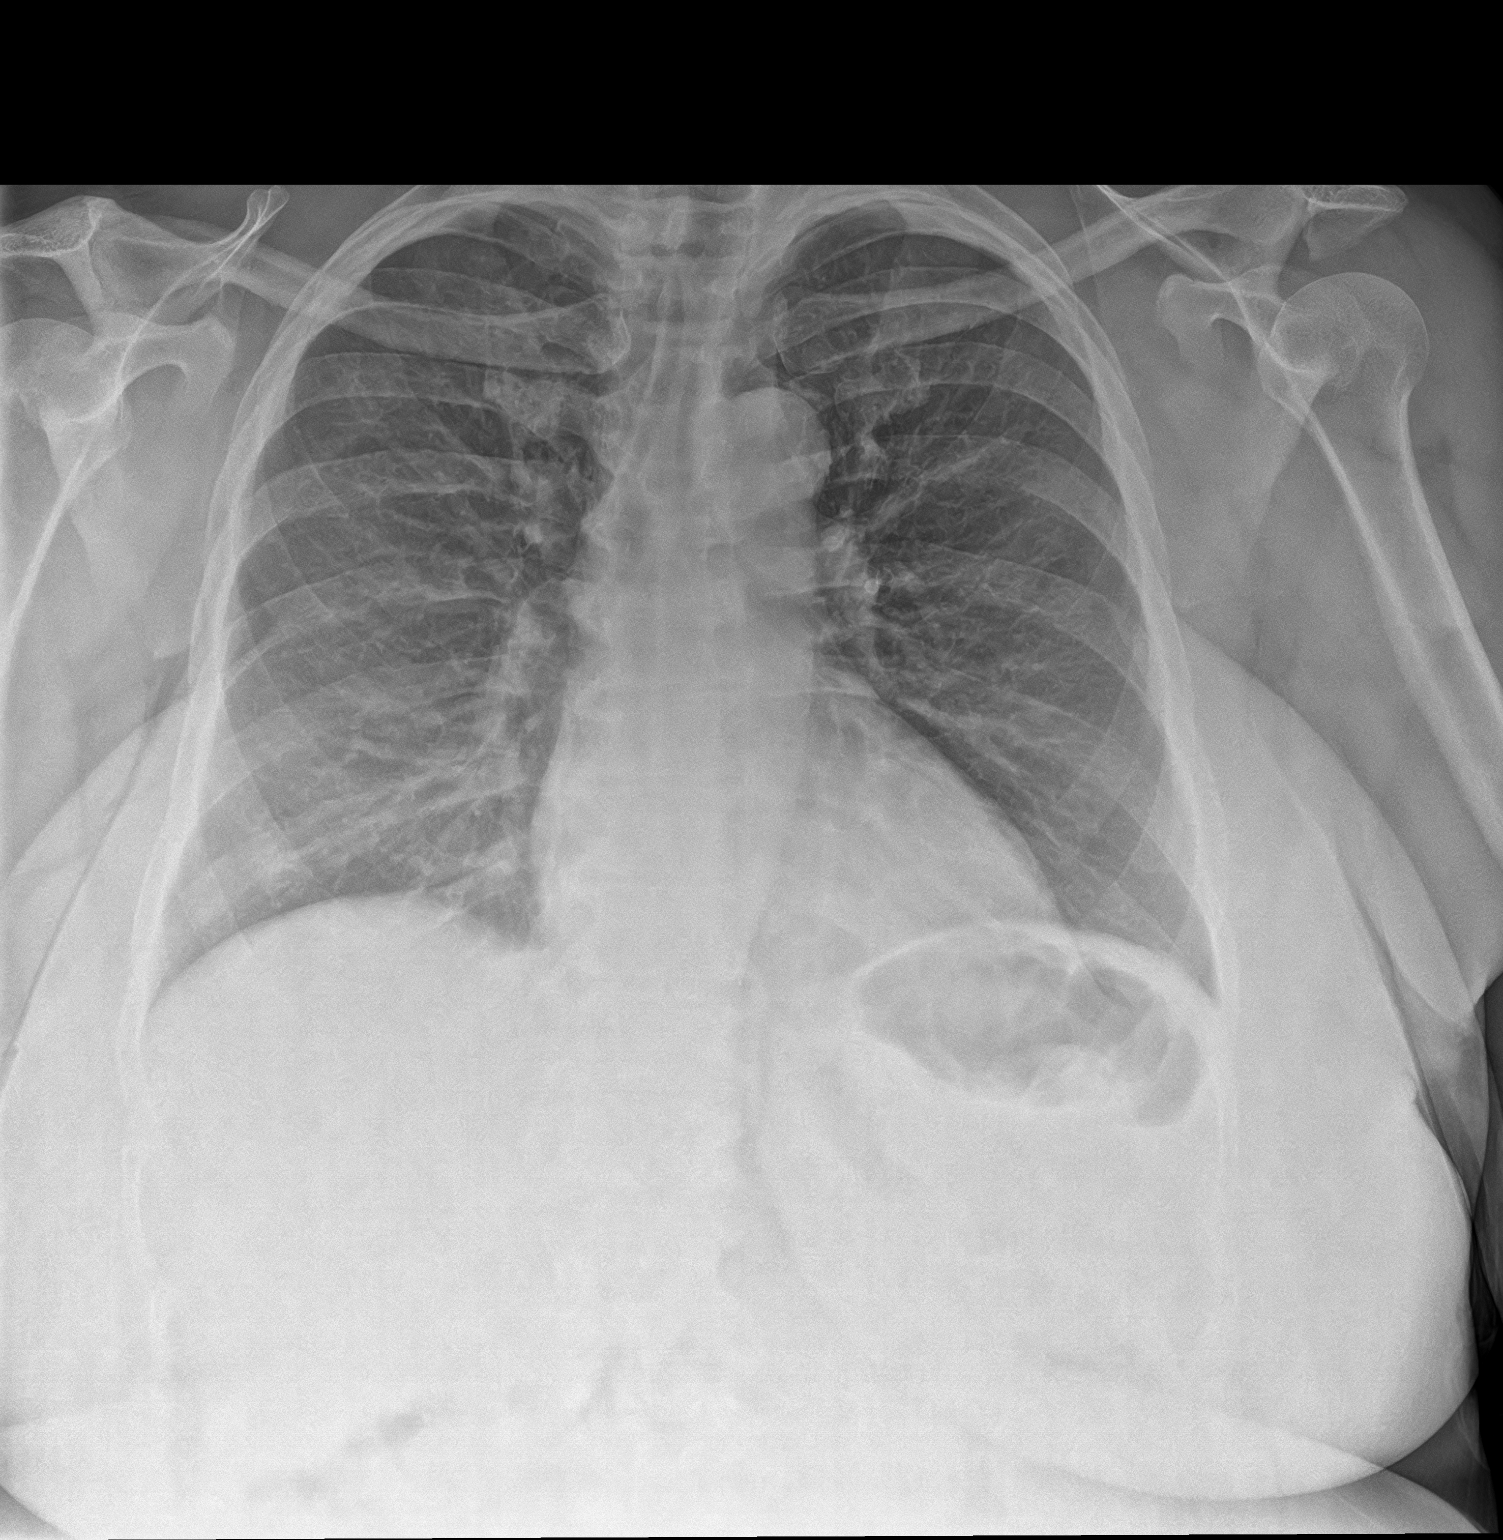

[chest lat]
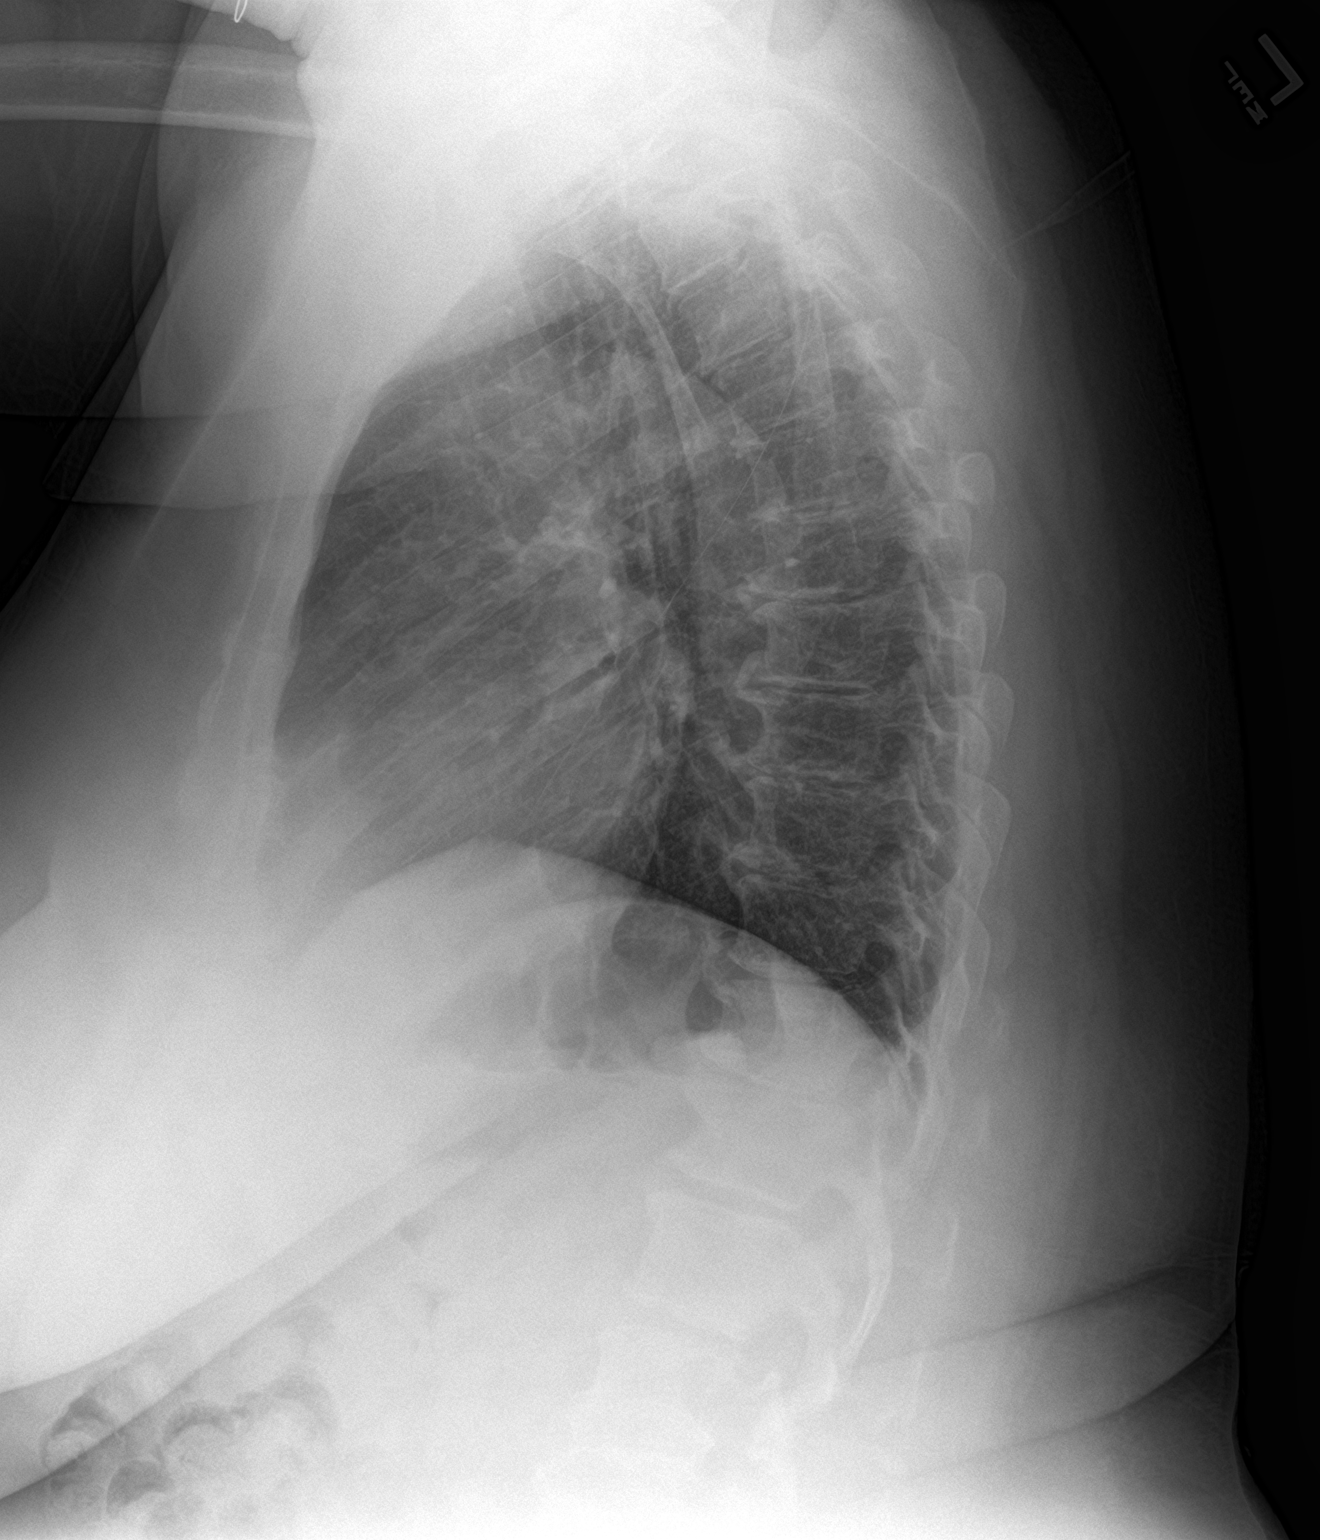

[2 of 2 positions shown; findings below may reference images not displayed]

FINDINGS: Lungs are clear. The heart size and pulmonary vascularity are
normal. No adenopathy. There is degenerative change in the thoracic
spine.
IMPRESSION: Lungs clear.  Cardiac silhouette within normal limits.

## 2021-12-31 ENCOUNTER — Other Ambulatory Visit: Payer: Self-pay | Admitting: Internal Medicine

## 2021-12-31 DIAGNOSIS — I10 Essential (primary) hypertension: Secondary | ICD-10-CM

## 2022-01-03 NOTE — Telephone Encounter (Signed)
Requested medications are due for refill today.  yes  Requested medications are on the active medications list.  yes  Last refill. 2022  Future visit scheduled.   no  Notes to clinic.  No pcp listed. Per chart pt is being seen at [facility]    Requested Prescriptions  Pending Prescriptions Disp Refills   benazepril (LOTENSIN) 20 MG tablet [Pharmacy Med Name: BENAZEPRIL HCL 20 MG TABLET] 90 tablet 1    Sig: TAKE 1 TABLET BY MOUTH EVERY DAY     Cardiovascular:  ACE Inhibitors Failed - 12/31/2021  8:45 AM      Failed - Cr in normal range and within 180 days    Creatinine, Ser  Date Value Ref Range Status  04/26/2020 0.89 0.57 - 1.00 mg/dL Final         Failed - K in normal range and within 180 days    Potassium  Date Value Ref Range Status  04/26/2020 4.2 3.5 - 5.2 mmol/L Final         Failed - Last BP in normal range    BP Readings from Last 1 Encounters:  04/26/20 (!) 142/86         Failed - Valid encounter within last 6 months    Recent Outpatient Visits           1 year ago Essential hypertension   [place] [facility], Nyoka Cowden, MD   1 year ago Essential hypertension   [place] [facility], Nyoka Cowden, MD   2 years ago Essential hypertension   [place] [facility] at [facility], Nyoka Cowden, MD   2 years ago Essential hypertension   [place] [facility] at [facility], Nyoka Cowden, MD   2 years ago Essential hypertension   [place] [facility] at [facility], Nyoka Cowden, MD              Passed - Patient is not pregnant       diltiazem (CARDIZEM CD) 120 MG 24 hr capsule [Pharmacy Med Name: DILTIAZEM 24H ER(CD) 120 MG CP] 90 capsule 1    Sig: TAKE 1 CAPSULE BY MOUTH EVERY DAY     Cardiovascular: Calcium Channel Blockers 3 Failed - 12/31/2021  8:45 AM       Failed - ALT in normal range and within 360 days    ALT  Date Value Ref Range Status  03/12/2019 12 7 - 35 Final         Failed - AST in normal range and within 360 days    AST  Date Value Ref Range Status  03/12/2019 15 13 - 35 Final         Failed - Cr in normal range and within 360 days    Creatinine, Ser  Date Value Ref Range Status  04/26/2020 0.89 0.57 - 1.00 mg/dL Final         Failed - Last BP in normal range    BP Readings from Last 1 Encounters:  04/26/20 (!) 142/86         Failed - Valid encounter within last 6 months    Recent Outpatient Visits           1 year ago Essential hypertension   [place] [facility] at [facility], Nyoka Cowden, MD  1 year ago Essential hypertension   [place] [facility] at [facility], Nyoka Cowden, MD   2 years ago Essential hypertension   [place] [facility] at [facility], Nyoka Cowden, MD   2 years ago Essential hypertension   [place] [facility] at [facility], Nyoka Cowden, MD   2 years ago Essential hypertension   [place] [facility], Nyoka Cowden, MD              Passed - Last Heart Rate in normal range    Pulse Readings from Last 1 Encounters:  04/26/20 (!) 108
